# Patient Record
Sex: Male | Born: 1955 | Race: White | Hispanic: No | Marital: Married | Smoking: Never smoker
Health system: Southern US, Community
[De-identification: ages and names within clinical notes are randomized; demographics above are authoritative.]

## PROBLEM LIST (undated history)

## (undated) ENCOUNTER — Emergency Department (HOSPITAL_BASED_OUTPATIENT_CLINIC_OR_DEPARTMENT_OTHER): Admission: EM | Payer: Self-pay | Source: Home / Self Care

## (undated) ENCOUNTER — Emergency Department (HOSPITAL_BASED_OUTPATIENT_CLINIC_OR_DEPARTMENT_OTHER): Admission: EM | Payer: 59 | Source: Home / Self Care

## (undated) DIAGNOSIS — I10 Essential (primary) hypertension: Secondary | ICD-10-CM

## (undated) DIAGNOSIS — E039 Hypothyroidism, unspecified: Secondary | ICD-10-CM

## (undated) DIAGNOSIS — E782 Mixed hyperlipidemia: Secondary | ICD-10-CM

## (undated) DIAGNOSIS — Z8679 Personal history of other diseases of the circulatory system: Secondary | ICD-10-CM

## (undated) DIAGNOSIS — Z87442 Personal history of urinary calculi: Secondary | ICD-10-CM

## (undated) DIAGNOSIS — Z8673 Personal history of transient ischemic attack (TIA), and cerebral infarction without residual deficits: Secondary | ICD-10-CM

## (undated) DIAGNOSIS — M109 Gout, unspecified: Secondary | ICD-10-CM

## (undated) DIAGNOSIS — Z9889 Other specified postprocedural states: Secondary | ICD-10-CM

## (undated) DIAGNOSIS — I639 Cerebral infarction, unspecified: Secondary | ICD-10-CM

## (undated) HISTORY — DX: Hypothyroidism, unspecified: E03.9

## (undated) HISTORY — DX: Essential (primary) hypertension: I10

---

## 2003-06-06 ENCOUNTER — Encounter: Payer: Self-pay | Admitting: Family Medicine

## 2003-06-06 ENCOUNTER — Ambulatory Visit (HOSPITAL_COMMUNITY): Admission: RE | Admit: 2003-06-06 | Discharge: 2003-06-06 | Payer: Self-pay | Admitting: Family Medicine

## 2003-07-04 ENCOUNTER — Ambulatory Visit (HOSPITAL_COMMUNITY): Admission: RE | Admit: 2003-07-04 | Discharge: 2003-07-04 | Payer: Self-pay | Admitting: Internal Medicine

## 2003-07-04 HISTORY — PX: ESOPHAGOGASTRODUODENOSCOPY: SHX1529

## 2003-07-04 HISTORY — PX: COLONOSCOPY: SHX174

## 2004-09-01 ENCOUNTER — Ambulatory Visit: Payer: Self-pay | Admitting: Internal Medicine

## 2005-12-27 ENCOUNTER — Encounter: Admission: RE | Admit: 2005-12-27 | Discharge: 2005-12-27 | Payer: Self-pay | Admitting: Neurology

## 2007-06-28 ENCOUNTER — Ambulatory Visit: Payer: Self-pay | Admitting: Cardiology

## 2009-06-21 DIAGNOSIS — E236 Other disorders of pituitary gland: Secondary | ICD-10-CM | POA: Insufficient documentation

## 2009-06-21 DIAGNOSIS — I10 Essential (primary) hypertension: Secondary | ICD-10-CM

## 2009-06-21 DIAGNOSIS — M109 Gout, unspecified: Secondary | ICD-10-CM

## 2009-06-21 DIAGNOSIS — E782 Mixed hyperlipidemia: Secondary | ICD-10-CM

## 2009-06-21 DIAGNOSIS — E039 Hypothyroidism, unspecified: Secondary | ICD-10-CM | POA: Insufficient documentation

## 2011-02-23 NOTE — Assessment & Plan Note (Signed)
[facility]                            CARDIOLOGY OFFICE NOTE   ESTEN, DOLLAR                    MRN:          [id number]  DATE:06/28/2007                            DOB:          November 21, 1955    REFERRING PHYSICIAN:  Alfredia Client, M.D.   REASON FOR REFERRAL:  Evaluate patient with multiple cardiovascular risk  factors and peripheral vascular disease.   HISTORY OF PRESENT ILLNESS:  Patient is a very pleasant 55 year old  gentleman without prior cardiac history; however, he did have a right  thalamic infarct in February, 2007.  This was apparently related to  small vessel disease, although I do not have an MRI or CT.  He has seen  Dr. Pearlean Brownie.  He was told this was related to his dyslipidemia and  hypertension.  He has never had any cardiac issues and does not report  any cardiac workup in the past.  He denies any chest discomfort, neck or  arm discomfort.  He says he is active at work but does not exercise  routinely.  He has had no palpitations, presyncope, or syncope.  He  denies any shortness of breath.  He does not have any PND or orthopnea.   PAST MEDICAL HISTORY:  1. Hyperlipidemia for approximately two years.  2. Hypertension for two years.  3. 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  4. Hypothyroidism.  5. Gout.   PAST SURGICAL HISTORY:  None.   ALLERGIES:  None.   MEDICATIONS:  1. Aggrenox b.i.d.  2. Lotrel 5/40 daily.  3. Advicor 1000 mg daily.  4. Levoxyl 25 mcg daily.  5. Allopurinol 100 mg daily.   SOCIAL HISTORY:  Patient works at [employer].  He is not married.  He has three children ages 55, 64, and 36.  He has three grandchildren  ages 67, 59, and 2.  He has never smoked cigarettes.  He does not drink  alcohol.   FAMILY HISTORY:  Noncontributory for early coronary artery disease.  His  father died of lung cancer at age 43.  He had a brother who died at an  early age of lung and brain cancer.   REVIEW OF  SYSTEMS:  As stated in the HPI and positive for reflux.  Negative for other systems.   PHYSICAL EXAMINATION:  Patient is in no acute distress.  Blood pressure 124/76, heart rate 66 and regular.  Weight 228 pounds.  Body Mass Index 30.  HEENT:  Eyelids unremarkable.  Pupils are equal, round and reactive to  light.  Fundi not visualized.  Oral mucosa unremarkable.  NECK:  No jugular venous distention at 45 degrees.  Carotid upstroke  brisk and symmetric.  No bruits, no thyromegaly.  LYMPHATICS:  No cervical, axillary, or inguinal adenopathy.  LUNGS:  Clear to auscultation bilaterally.  BACK:  No costovertebral angle tenderness.  CHEST:  Unremarkable.  HEART:  PMI not displaced or sustained.  S1 and S2 within normal limits.  No S3, no S4, no clicks, rubs, or murmurs.  ABDOMEN:  Obese, positive bowel sounds.  Normal in frequency and pitch.  No bruits,  rebound, guarding.  There are no midline pulsatile masses.  No hepatomegaly, no splenomegaly.  SKIN:  No rashes, no nodules.  EXTREMITIES:  Pulses 2+ throughout.  No cyanosis, no clubbing, no edema.  NEUROLOGIC:  Alert and oriented to person, place, and time.  Cranial  nerves II-XII grossly intact.  Motor grossly intact throughout.   EKG:  Sinus rhythm, rate 66, axis within normal limits, intervals within  normal limits.  No acute ST-T wave changes.  Poor anterior R wave  progression.  Low voltage is on the limb leads.   ASSESSMENT/PLAN:  1. Vascular disease:  The patient does have evidence of vascular      disease in the central nervous system.  He has significant      cardiovascular risk factors.  He does not have any symptoms of      obstructive coronary disease; however, he needs screening with an      exercise treadmill test.  I think this would be most helpful in      ruling out obstructive coronary disease (low pre-test probability),      risk stratifying, and most importantly, giving him a prescription      for exercise.  2.  Hypertension:  Patient's blood pressure is well controlled.  Have      him continue medications as listed.  3. Dyslipidemia:  I agree with aggressive management per [facility].  4. Weight:  Patient understands the need to lose weight with diet and      exercise.  His Body Mass Index has crossed into the obese range.  5. Followup will be based on the results of the stress test.     Rollene Rotunda, MD, [facility]  Electronically Signed    JH/MedQ  DD: 06/28/2007  DT: 06/29/2007  Job #: [id number]   cc:   Ernestina Penna, M.D.

## 2011-02-26 NOTE — Consult Note (Signed)
NAME:  Russell Odom, Russell Odom NO.:  [id number]   MEDICAL RECORD NO.:  [id number]                   PATIENT TYPE:   LOCATION:                                       FACILITY:   PHYSICIAN:  R. Roetta Sessions, M.D.              DATE OF BIRTH:  06-18-1956   DATE OF CONSULTATION:  DATE OF DISCHARGE:                                   CONSULTATION   REFERRING PHYSICIAN:  Gaynelle Cage, MD   REASON FOR CONSULTATION:  Difficulty swallowing.   HISTORY OF PRESENT ILLNESS:  The patient is a 55 year old, Caucasian  gentleman who presents today for further evaluation of dysphagia.  He gives  a two-week history of what he describes as sensation of something stuck in  his throat.  This has now gone away.  However, upon further questioning, the  patient tells me for the last two to three years, he has had an episode  every month or so where he will be eating and then all of a sudden feels the  food will no longer go down.  He will sometimes either induce or have  spontaneous vomiting.  He has no episodes in between this, however.  He  denies any heartburn symptoms, abdominal pain, constipation, diarrhea or  melena.  He has had intermittent bright red blood noted on the toilet tissue  for the last one year.   CURRENT MEDICATIONS:  Indocin 75 mg p.r.n.   ALLERGIES:  No known drug allergies.   PAST MEDICAL HISTORY:  Gout.   PAST SURGICAL HISTORY:  Nasal surgery.   FAMILY HISTORY:  Father died of lung cancer.  His sister recently had a  partial intestinal resection for unclear reasons, although the patient tells  me they are 80% sure that it is not cancer.   SOCIAL HISTORY:  Divorced.  He has three daughters.  He is employed with  [employer].  He has never been a smoker.  He occasionally consumes  alcohol, generally once per month.   REVIEW OF SYMPTOMS:  GASTROINTESTINAL:  See HPI.  CONSTITUTIONAL:  Denies  any weight loss.  CARDIOPULMONARY:  Denies any chest pain or  shortness of  breath.   PHYSICAL EXAMINATION:  VITAL SIGNS:  Weight 250 pounds, height 6 feet 1  inch, temperature 95.6, blood pressure 130/90, pulse 56.  GENERAL:  Pleasant, well-developed, well-nourished, Caucasian male in no  acute distress.  SKIN:  Warm and dry.  No jaundice.  HEENT:  Conjunctivae are pink.  Sclerae nonicteric.  Oropharyngeal mucosa  moist and pink.  No lesions, erythema or exudate.  No lymphadenopathy or  thyromegaly.  CHEST:  Lungs clear to auscultation.  CARDIAC:  Regular rate and rhythm, normal S1, S2, no murmurs, rubs or  gallops.  ABDOMEN:  Positive bowel sounds.  Soft, nondistended, nontender.  No  organomegaly or masses.  He has very small, very reducible, nontender  umbilical hernia.  EXTREMITIES:  No edema.   LABORATORY DATA AND X-RAY FINDINGS:  Barium esophagram on June 06, 2003,  revealed small to moderate size sliding hiatal hernia with episodes of  spontaneous gastroesophageal reflux with slightly irregular mucosal pattern  seen in the mid and lower thoracic esophagus suggesting mild generalized  esophagitis.  A 13 mm barium tablet passed without any difficulty.   IMPRESSION:  Russell Odom is a 55 year old gentleman who has chronic  intermittent episodes of dysphagia primarily to solid foods.  At this time,  he has to induce vomiting to relieve his symptoms.  On recent barium  esophagram, there was no evidence of stricture; however, he did have  spontaneous gastroesophageal reflux as well as possible esophagitis.  The  patient denies any typical reflux symptoms.  I discussed with him  possibility of upper endoscopy for further evaluation of barium swallow  findings as well as at least empirical esophageal dilatation.  He would like  to proceed with this.  In addition, he has chronic, intermittent  hematochezia.   RECOMMENDATIONS:  1. EGD with dilatation and colonoscopy in the near future.  2. I discussed risks, benefits and alternatives with  the patient and he is     agreeable to proceed.  3. Further recommendations to follow.   I would like to thank Dr. Reola Calkins for allowing [place] to take part in the care of  this patient.     Tana Coast, Pricilla Larsson, M.D.    LL/MEDQ  D:  06/26/2003  T:  06/26/2003  Job:  [id number]

## 2011-02-26 NOTE — Op Note (Signed)
NAME:  ICHAEL, PULLARA                       ACCOUNT NO.:  [id number]   MEDICAL RECORD NO.:  [id number]                   PATIENT TYPE:  AMB   LOCATION:  DAY                                  FACILITY:  [facility]   PHYSICIAN:  R. Roetta Sessions, M.D.              DATE OF BIRTH:  1956/09/11   DATE OF PROCEDURE:  07/04/2003  DATE OF DISCHARGE:                                 OPERATIVE REPORT   PROCEDURE:  Esophagogastroduodenoscopy with Elease Hashimoto dilation followed by  colonoscopy.   INDICATIONS FOR PROCEDURE:  The patient is a 55 year old gentleman with  intermittent episodes of esophageal dysphagia to solids.  He also has had  intermittent hematochezia.  EGD and colonoscopy are now being done.  This  approach has been discussed with the patient at length previously.  The  potential risks, benefits, and alternatives have been reviewed and questions  answered.  Please see the documentation in the medical record and the  dictated H&P from 06/26/2003.   PROCEDURE:  O2 saturation, blood pressure, pulses, and respirations were  monitored throughout the entirety of both procedures.  Conscious sedation  was with Versed 3 mg IV, Demerol 75 mg IV in divided doses.  The instrument  used was the Olympus video chip adult gastroscope and colonoscope.   EGD FINDINGS:  Examination of the tubular esophagus revealed multiple linear  esophageal erosions extending as proximally as one-half up the tubular  esophagus.  There was no evidence of Barrett's esophagus or neoplasia.  There was no obvious ring.  The EG junction was easily traversed.   Stomach:  The gastric cavity was empty and insufflated well with air.  Thorough examination of the gastric mucosa including retroflex view in the  proximal stomach and esophagogastric junction demonstrated multiple antral  erosions.  There was no infiltrating process or frank ulcer.  The pylorus  was patent and easily traversed.   Duodenum:  Examination of the bulb  revealed, again, multiple erosions in the  bulb.  D2 was normal.   THERAPEUTIC/DIAGNOSTIC MANEUVERS PERFORMED:  A 56 French Maloney dilator was  passed with ease.  A look back revealed no apparent complication related to  passage of the dilator.   The patient tolerated the procedure well and was prepared for colonoscopy.   EGD FINDINGS:  Digital rectal examination revealed no abnormalities.   ENDOSCOPIC FINDINGS:  The prep was adequate.   Rectum:  Examination of the rectal mucosa including retroflex view of the  anal verge and en face view of the anal canal revealed only anal canal  hemorrhoids.   Colon:  The colonic mucosa was surveyed from the rectosigmoid junction  through the left, transverse, right colon to the area of the appendiceal  orifice, ileocecal valve, and cecum.  These structures were well-seen and  photographed for the record.  The colonic mucosa all the way to the cecum  appeared normal.  From the level of the cecum  and ileocecal valve, the scope  was slowly withdrawn.  All previously mentioned mucosal surfaces were again  seen, and again, no other abnormalities were observed.  The patient  tolerated both procedures well and was reactive in endoscopy.   EGD IMPRESSION:  1. Multiple linear esophageal erosions consistent with mild-to-moderate     erosive reflux esophagitis, status post passage of a Maloney dilator.  2. Multiple antral and duodenal bulbar erosions; otherwise, the remainder of     the stomach and the first and second portions of the duodenum appeared     normal.   I suspect the findings as seen in the stomach and bulb are secondary to  Indocin effect.   RECOMMENDATIONS:  1. Will check H pylori serologies today.  2. Gastroesophageal reflux literature provided to Mr. Hoban.  3. Begin Aciphex 20 mg orally daily before breakfast.  He is to go by my     office for samples, and a prescription has been provided.  4. Hemorrhoid literature provided to  Mr. Patil.  5. A 10-day course of Anusol-HC suppositories, one per rectum at bedtime.  6. Followup appointment with me in four to six weeks to assess his progress.  7. Follow up on H pylori serologies.       ___________________________________________                                            Jonathon Bellows, M.D.   RMR/MEDQ  D:  07/04/2003  T:  07/04/2003  Job:  [id number]   cc:   Gaynelle Cage, MD  [phone] [address]  [place]  [place] [address]  Fax: [phone]

## 2012-10-18 ENCOUNTER — Encounter: Payer: Self-pay | Admitting: Internal Medicine

## 2012-10-19 ENCOUNTER — Encounter (HOSPITAL_COMMUNITY): Payer: Self-pay | Admitting: Pharmacy Technician

## 2012-10-19 ENCOUNTER — Encounter: Payer: Self-pay | Admitting: Urgent Care

## 2012-10-19 ENCOUNTER — Ambulatory Visit (INDEPENDENT_AMBULATORY_CARE_PROVIDER_SITE_OTHER): Payer: Managed Care, Other (non HMO) | Admitting: Urgent Care

## 2012-10-19 VITALS — BP 127/74 | HR 75 | Temp 97.0°F | Ht 73.0 in | Wt 239.4 lb

## 2012-10-19 DIAGNOSIS — Z800 Family history of malignant neoplasm of digestive organs: Secondary | ICD-10-CM | POA: Insufficient documentation

## 2012-10-19 DIAGNOSIS — K921 Melena: Secondary | ICD-10-CM | POA: Insufficient documentation

## 2012-10-19 MED ORDER — PEG 3350-KCL-NA BICARB-NACL 420 G PO SOLR: 4000.0000 mL | ORAL | Status: DC

## 2012-10-19 NOTE — Progress Notes (Signed)
Referring Provider: Dr. Massenburg Primary Care Physician:  MASSENBURG,O'LAF, PA Primary Gastroenterologist:  Dr. Rourk  Chief Complaint  Patient presents with  . Colonoscopy    hematochezia, Family Hx of colon ca    HPI:  Russell Odom is a 56 y.o. male here as a referral from Dr. Massenburg for colonoscopy given family history of colon cancer diagnosed at age 58 in his sister.  His last colonoscopy was normal by Dr Rourk in 2004.  He has noticed scant hematochezia on several occasions with wiping on toilet tissue over past couple months.   Denies constipation, diarrhea, or weight loss.   Denies heartburn, indigestion, nausea, vomiting, dysphagia, odynophagia or anorexia.  On aggrenox. Denies OTC NSAIDS.  Past Medical History  Diagnosis Date  . CVA (cerebral vascular accident)   . HTN (hypertension)   . Hypothyroid   . Gout     Past Surgical History  Procedure Date  . Esophagogastroduodenoscopy 07/04/2003    RMR: Multiple linear esophageal erosions consistent with mild-to-moderate  erosive reflux esophagitis, status post passage of a 56 F Maloney dilator/ Multiple antral and duodenal bulbar erosions; otherwise, the remainder of the stomach and the first and second portions of the duodenum appeared normal  . Colonoscopy   07/04/2003    RMR: anal canal hemorrhoids, normal colon    Current Outpatient Prescriptions  Medication Sig Dispense Refill  . allopurinol (ZYLOPRIM) 100 MG tablet Take 100 mg by mouth daily.      . amLODipine-benazepril (LOTREL) 5-40 MG per capsule Take 1 capsule by mouth daily.      . dipyridamole-aspirin (AGGRENOX) 200-25 MG per 12 hr capsule Take 1 capsule by mouth 2 (two) times daily.      . levothyroxine (SYNTHROID, LEVOTHROID) 50 MCG tablet Take 50 mcg by mouth daily.      . Multiple Vitamins-Minerals (MULTIVITAMINS THER. W/MINERALS) TABS Take 1 tablet by mouth daily.      . niacin-lovastatin (ADVICOR) 1000-20 MG 24 hr tablet Take 1 tablet by mouth at  bedtime.        Allergies as of 10/19/2012  . (No Known Allergies)    Family History  Problem Relation Age of Onset  . Colon cancer Sister 58    History   Social History  . Marital Status: Divorced    Spouse Name: N/A    Number of Children: 3  . Years of Education: N/A   Occupational History  . Supervisor at [employer]    Social History Main Topics  . Smoking status: Never Smoker   . Smokeless tobacco: Not on file  . Alcohol Use: No  . Drug Use: No  . Sexually Active: Not on file   Other Topics Concern  . Not on file   Social History Narrative  . No narrative on file    Review of Systems: Gen: Denies any fever, chills, sweats, anorexia, fatigue, weakness, malaise, weight loss, and sleep disorder CV: Denies chest pain, angina, palpitations, syncope, orthopnea, PND, peripheral edema, and claudication. Resp: Denies dyspnea at rest, dyspnea with exercise, cough, sputum, wheezing, coughing up blood, and pleurisy. GI: Denies vomiting blood, jaundice, and fecal incontinence. GU : Denies urinary burning, blood in urine, urinary frequency, urinary hesitancy, nocturnal urination, and urinary incontinence. MS: Denies joint pain, limitation of movement, and swelling, stiffness, low back pain, extremity pain. Denies muscle weakness, cramps, atrophy.  Derm: Denies rash, itching, dry skin, hives, moles, warts, or unhealing ulcers.  Psych: Denies depression, anxiety, memory loss, suicidal ideation, hallucinations,   paranoia, and confusion. Heme: Denies bruising or enlarged lymph nodes. Neuro:  Denies any headaches, dizziness, paresthesias. Endo:  Denies any problems with DM, thyroid, adrenal function.  Physical Exam: BP 127/74  Pulse 75  Temp 97 F (36.1 C) (Oral)  Ht 6' 1" (1.854 m)  Wt 239 lb 6.4 oz (108.591 kg)  BMI 31.58 kg/m2 No LMP for male patient. General:   Alert,  Well-developed, well-nourished, pleasant and cooperative in NAD Head:  Normocephalic and  atraumatic. Eyes:  Sclera clear, no icterus.   Conjunctiva pink. Ears:  Normal auditory acuity. Nose:  No deformity, discharge, or lesions. Mouth:  No deformity or lesions,oropharynx pink & moist. Neck:  Supple; no masses or thyromegaly. Lungs:  Clear throughout to auscultation.   No wheezes, crackles, or rhonchi. No acute distress. Heart:  Regular rate and rhythm; no murmurs, clicks, rubs,  or gallops. Abdomen:  Normal bowel sounds.  No bruits.  Soft, non-tender and non-distended without masses, hepatosplenomegaly or hernias noted.  No guarding or rebound tenderness.   Rectal:  Deferred.  Msk:  Symmetrical without gross deformities. Normal posture. Pulses:  Normal pulses noted. Extremities:  No clubbing or edema. Neurologic:  Alert and oriented x4;  grossly normal neurologically. Skin:  Intact without significant lesions or rashes. Lymph Nodes:  No significant cervical adenopathy. Psych:  Alert and cooperative. Normal mood and affect.  

## 2012-10-19 NOTE — Assessment & Plan Note (Signed)
Russell Odom is a pleasant 57 y.o. male with family history of colon cancer in his sister at age 94.  Due for colonoscopy, but also having scant hematochezia. Suspect benign anorectal source, but cannot r/o colorectal ca or polyp.  Colonoscopy with Dr Jena Gauss.  I have discussed risks & benefits which include, but are not limited to, bleeding, infection, perforation & drug reaction.  The patient agrees with this plan & written consent will be obtained.

## 2012-10-19 NOTE — Patient Instructions (Addendum)
Colonoscopy with Dr Jena Gauss  Rectal Bleeding Rectal bleeding is when blood passes out of the anus. It is usually a sign that something is wrong. It may not be serious, but it should always be evaluated. Rectal bleeding may present as bright red blood or extremely dark stools. The color may range from dark red or maroon to black (like tar). It is important that the cause of rectal bleeding be identified so treatment can be started and the problem corrected. CAUSES   Hemorrhoids. These are enlarged (dilated) blood vessels or veins in the anal or rectal area.  Fistulas. Theseare abnormal, burrowing channels that usually run from inside the rectum to the skin around the anus. They can bleed.  Anal fissures. This is a tear in the tissue of the anus. Bleeding occurs with bowel movements.  Diverticulosis. This is a condition in which pockets or sacs project from the bowel wall. Occasionally, the sacs can bleed.  Diverticulitis. Thisis an infection involving diverticulosis of the colon.  Proctitis and colitis. These are conditions in which the rectum, colon, or both, can become inflamed and pitted (ulcerated).  Polyps and cancer. Polyps are non-cancerous (benign) growths in the colon that may bleed. Certain types of polyps turn into cancer.  Protrusion of the rectum. Part of the rectum can project from the anus and bleed.  Certain medicines.  Intestinal infections.  Blood vessel abnormalities. HOME CARE INSTRUCTIONS  Eat a high-fiber diet to keep your stool soft.  Limit activity.  Drink enough fluids to keep your urine clear or pale yellow.  Warm baths may be useful to soothe rectal pain.  Follow up with your caregiver as directed. SEEK IMMEDIATE MEDICAL CARE IF:  You develop increased bleeding.  You have black or dark red stools.  You vomit blood or material that looks like coffee grounds.  You have abdominal pain or tenderness.  You have a fever.  You feel weak, nauseous,  or you faint.  You have severe rectal pain or you are unable to have a bowel movement. MAKE SURE YOU:  Understand these instructions.  Will watch your condition.  Will get help right away if you are not doing well or get worse. Document Released: 03/19/2002 Document Revised: 12/20/2011 Document Reviewed: 03/14/2011 [facility] Patient Information 2013 [place], [place].

## 2012-10-19 NOTE — Assessment & Plan Note (Signed)
See FH colon ca

## 2012-10-20 NOTE — Progress Notes (Signed)
Faxed to PCP

## 2012-11-02 ENCOUNTER — Ambulatory Visit (HOSPITAL_COMMUNITY)
Admission: RE | Admit: 2012-11-02 | Discharge: 2012-11-02 | Disposition: A | Discharge status: Home / Self Care | Payer: Managed Care, Other (non HMO) | Source: Ambulatory Visit | Attending: Internal Medicine | Admitting: Internal Medicine

## 2012-11-02 ENCOUNTER — Encounter (HOSPITAL_COMMUNITY): Payer: Self-pay

## 2012-11-02 ENCOUNTER — Encounter (HOSPITAL_COMMUNITY)
Admission: RE | Disposition: A | Discharge status: Home / Self Care | Payer: Self-pay | Source: Ambulatory Visit | Attending: Internal Medicine

## 2012-11-02 DIAGNOSIS — K5730 Diverticulosis of large intestine without perforation or abscess without bleeding: Secondary | ICD-10-CM

## 2012-11-02 DIAGNOSIS — D126 Benign neoplasm of colon, unspecified: Secondary | ICD-10-CM | POA: Insufficient documentation

## 2012-11-02 DIAGNOSIS — K921 Melena: Secondary | ICD-10-CM | POA: Insufficient documentation

## 2012-11-02 DIAGNOSIS — I10 Essential (primary) hypertension: Secondary | ICD-10-CM | POA: Insufficient documentation

## 2012-11-02 DIAGNOSIS — Z800 Family history of malignant neoplasm of digestive organs: Secondary | ICD-10-CM

## 2012-11-02 HISTORY — PX: COLONOSCOPY: SHX5424

## 2012-11-02 SURGERY — COLONOSCOPY
Anesthesia: Moderate Sedation

## 2012-11-02 MED ORDER — STERILE WATER FOR IRRIGATION IR SOLN
Status: DC | PRN
  Administered 2012-11-02: 10:00:00

## 2012-11-02 MED ORDER — ONDANSETRON HCL 4 MG/2ML IJ SOLN
INTRAMUSCULAR | Status: AC
  Filled 2012-11-02: qty 2

## 2012-11-02 MED ORDER — SODIUM CHLORIDE 0.45 % IV SOLN
INTRAVENOUS | Status: DC
  Administered 2012-11-02: 09:00:00 via INTRAVENOUS

## 2012-11-02 MED ORDER — MEPERIDINE HCL 100 MG/ML IJ SOLN
INTRAMUSCULAR | Status: DC | PRN
  Administered 2012-11-02 (×2): 50 mg via INTRAVENOUS
  Administered 2012-11-02: 25 mg via INTRAVENOUS

## 2012-11-02 MED ORDER — MEPERIDINE HCL 100 MG/ML IJ SOLN
INTRAMUSCULAR | Status: AC
  Filled 2012-11-02: qty 2

## 2012-11-02 MED ORDER — MIDAZOLAM HCL 5 MG/5ML IJ SOLN
INTRAMUSCULAR | Status: DC | PRN
  Administered 2012-11-02: 1 mg via INTRAVENOUS
  Administered 2012-11-02 (×2): 2 mg via INTRAVENOUS

## 2012-11-02 MED ORDER — MIDAZOLAM HCL 5 MG/5ML IJ SOLN
INTRAMUSCULAR | Status: AC
  Filled 2012-11-02: qty 10

## 2012-11-02 NOTE — Op Note (Signed)
[facility] [address] [place], [address]   COLONOSCOPY PROCEDURE REPORT  PATIENT: Russell Odom, Russell Odom  MR#:         [id number] BIRTHDATE: 05/17/1956 , 56  yrs. old GENDER: Male ENDOSCOPIST:   Dr. Jena Gauss REFERRED BY:       [place] PROCEDURE DATE:  11/02/2012 PROCEDURE:     Ileo-colonoscopy with snare polypectomy  INDICATIONS: hematochezia; positive family history of colon cancer  INFORMED CONSENT:  The risks, benefits, alternatives and imponderables including but not limited to bleeding, perforation as well as the possibility of a missed lesion have been reviewed.  The potential for biopsy, lesion removal, etc. have also been discussed.  Questions have been answered.  All parties agreeable. Please see the history and physical in the medical record for more information.  MEDICATIONS: Versed 5 mg IV and Demerol 125 mg IV in divided doses. Zofran 4 mg IV  DESCRIPTION OF PROCEDURE:  After a digital rectal exam was performed, the EC-[id number] ([id number])  colonoscope was advanced from the anus through the rectum and colon to the area of the cecum, ileocecal valve and appendiceal orifice.  The cecum was deeply intubated.  These structures were well-seen and photographed for the record.  From the level of the cecum and ileocecal valve, the scope was slowly and cautiously withdrawn.  The mucosal surfaces were carefully surveyed utilizing scope tip deflection to facilitate fold flattening as needed.  The scope was pulled down into the rectum where a thorough examination including retroflexion was performed.    FINDINGS: Friable anorectum. However, no significant hemorrhoids. Normal rectum. Left-sided diverticula. (1) 6 mm polyp in the mid descending segment; otherwise, the remainder of the colonic mucosa appeared normal.  THERAPEUTIC / DIAGNOSTIC MANEUVERS PERFORMED:  The above-mentioned polyp was removed with hot snare technique  COMPLICATIONS: None  CECAL  WITHDRAWAL TIME:  17 minutes  IMPRESSION:  Friable Anorectum - likely source of hematochezia. Colonic diverticulosis. Colonic polyp-removed as described above.  RECOMMENDATIONS: Course of Anusol suppositories. Daily fiber supplement-Benefiber 2 tablespoons. Followup on pathology.   _______________________________ eSigned:  R. Roetta Sessions, MD FACP [facility] 11/02/2012 10:58 AM   CC:    PATIENT NAME:  Russell Odom MR#: [id number]

## 2012-11-02 NOTE — H&P (View-Only) (Signed)
Referring Provider: Dr. Pollyann Kennedy Primary Care Physician:  MASSENBURG,O'LAF, PA Primary Gastroenterologist:  Dr. Jena Gauss  Chief Complaint  Patient presents with  . Colonoscopy    hematochezia, Family Hx of colon ca    HPI:  Russell Odom is a 57 y.o. male here as a referral from Dr. Pollyann Kennedy for colonoscopy given family history of colon cancer diagnosed at age 35 in his sister.  His last colonoscopy was normal by Dr Jena Gauss in 2004.  He has noticed scant hematochezia on several occasions with wiping on toilet tissue over past couple months.   Denies constipation, diarrhea, or weight loss.   Denies heartburn, indigestion, nausea, vomiting, dysphagia, odynophagia or anorexia.  On aggrenox. Denies OTC NSAIDS.  Past Medical History  Diagnosis Date  . CVA (cerebral vascular accident)   . HTN (hypertension)   . Hypothyroid   . Gout     Past Surgical History  Procedure Date  . Esophagogastroduodenoscopy 07/04/2003    RMR: Multiple linear esophageal erosions consistent with mild-to-moderate  erosive reflux esophagitis, status post passage of a 23 F Maloney dilator/ Multiple antral and duodenal bulbar erosions; otherwise, the remainder of the stomach and the first and second portions of the duodenum appeared normal  . Colonoscopy   07/04/2003    RMR: anal canal hemorrhoids, normal colon    Current Outpatient Prescriptions  Medication Sig Dispense Refill  . allopurinol (ZYLOPRIM) 100 MG tablet Take 100 mg by mouth daily.      Marland Kitchen amLODipine-benazepril (LOTREL) 5-40 MG per capsule Take 1 capsule by mouth daily.      Marland Kitchen dipyridamole-aspirin (AGGRENOX) 200-25 MG per 12 hr capsule Take 1 capsule by mouth 2 (two) times daily.      Marland Kitchen levothyroxine (SYNTHROID, LEVOTHROID) 50 MCG tablet Take 50 mcg by mouth daily.      . Multiple Vitamins-Minerals (MULTIVITAMINS THER. W/MINERALS) TABS Take 1 tablet by mouth daily.      . niacin-lovastatin (ADVICOR) 1000-20 MG 24 hr tablet Take 1 tablet by mouth at  bedtime.        Allergies as of 10/19/2012  . (No Known Allergies)    Family History  Problem Relation Age of Onset  . Colon cancer Sister 48    History   Social History  . Marital Status: Divorced    Spouse Name: N/A    Number of Children: 3  . Years of Education: N/A   Occupational History  . Supervisor at [employer]    Social History Main Topics  . Smoking status: Never Smoker   . Smokeless tobacco: Not on file  . Alcohol Use: No  . Drug Use: No  . Sexually Active: Not on file   Other Topics Concern  . Not on file   Social History Narrative  . No narrative on file    Review of Systems: Gen: Denies any fever, chills, sweats, anorexia, fatigue, weakness, malaise, weight loss, and sleep disorder CV: Denies chest pain, angina, palpitations, syncope, orthopnea, PND, peripheral edema, and claudication. Resp: Denies dyspnea at rest, dyspnea with exercise, cough, sputum, wheezing, coughing up blood, and pleurisy. GI: Denies vomiting blood, jaundice, and fecal incontinence. GU : Denies urinary burning, blood in urine, urinary frequency, urinary hesitancy, nocturnal urination, and urinary incontinence. MS: Denies joint pain, limitation of movement, and swelling, stiffness, low back pain, extremity pain. Denies muscle weakness, cramps, atrophy.  Derm: Denies rash, itching, dry skin, hives, moles, warts, or unhealing ulcers.  Psych: Denies depression, anxiety, memory loss, suicidal ideation, hallucinations,  paranoia, and confusion. Heme: Denies bruising or enlarged lymph nodes. Neuro:  Denies any headaches, dizziness, paresthesias. Endo:  Denies any problems with DM, thyroid, adrenal function.  Physical Exam: BP 127/74  Pulse 75  Temp 97 F (36.1 C) (Oral)  Ht 6\' 1"  (1.854 m)  Wt 239 lb 6.4 oz (108.591 kg)  BMI 31.58 kg/m2 No LMP for male patient. General:   Alert,  Well-developed, well-nourished, pleasant and cooperative in NAD Head:  Normocephalic and  atraumatic. Eyes:  Sclera clear, no icterus.   Conjunctiva pink. Ears:  Normal auditory acuity. Nose:  No deformity, discharge, or lesions. Mouth:  No deformity or lesions,oropharynx pink & moist. Neck:  Supple; no masses or thyromegaly. Lungs:  Clear throughout to auscultation.   No wheezes, crackles, or rhonchi. No acute distress. Heart:  Regular rate and rhythm; no murmurs, clicks, rubs,  or gallops. Abdomen:  Normal bowel sounds.  No bruits.  Soft, non-tender and non-distended without masses, hepatosplenomegaly or hernias noted.  No guarding or rebound tenderness.   Rectal:  Deferred.  Msk:  Symmetrical without gross deformities. Normal posture. Pulses:  Normal pulses noted. Extremities:  No clubbing or edema. Neurologic:  Alert and oriented x4;  grossly normal neurologically. Skin:  Intact without significant lesions or rashes. Lymph Nodes:  No significant cervical adenopathy. Psych:  Alert and cooperative. Normal mood and affect.

## 2012-11-02 NOTE — Interval H&P Note (Signed)
History and Physical Interval Note:  11/02/2012 9:46 AM  Russell Odom  has presented today for surgery, with the diagnosis of HEMATOCHEZIA  AND FAMILY HX OF COLON CA  The various methods of treatment have been discussed with the patient and family. After consideration of risks, benefits and other options for treatment, the patient has consented to  Procedure(s) (LRB) with comments: COLONOSCOPY (N/A) - 9:30 as a surgical intervention .  The patient's history has been reviewed, patient examined, no change in status, stable for surgery.  I have reviewed the patient's chart and labs.  Questions were answered to the patient's satisfaction.     Eula Listen  Colonoscopy today per plan.The risks, benefits, limitations, alternatives and imponderables have been reviewed with the patient. Questions have been answered. All parties are agreeable.

## 2012-11-04 ENCOUNTER — Encounter: Payer: Self-pay | Admitting: Internal Medicine

## 2012-11-06 ENCOUNTER — Encounter (HOSPITAL_COMMUNITY): Payer: Self-pay | Admitting: Internal Medicine

## 2012-11-06 ENCOUNTER — Encounter: Payer: Self-pay | Admitting: *Deleted

## 2012-11-25 ENCOUNTER — Other Ambulatory Visit: Payer: Self-pay

## 2013-02-05 ENCOUNTER — Ambulatory Visit (INDEPENDENT_AMBULATORY_CARE_PROVIDER_SITE_OTHER): Payer: Managed Care, Other (non HMO) | Admitting: Nurse Practitioner

## 2013-02-05 ENCOUNTER — Encounter: Payer: Self-pay | Admitting: Nurse Practitioner

## 2013-02-05 VITALS — BP 129/75 | HR 63 | Ht 75.0 in | Wt 243.0 lb

## 2013-02-05 DIAGNOSIS — I63219 Cerebral infarction due to unspecified occlusion or stenosis of unspecified vertebral arteries: Secondary | ICD-10-CM

## 2013-02-05 DIAGNOSIS — I10 Essential (primary) hypertension: Secondary | ICD-10-CM

## 2013-02-05 DIAGNOSIS — E782 Mixed hyperlipidemia: Secondary | ICD-10-CM

## 2013-02-05 NOTE — Progress Notes (Signed)
HPI:  Patient returns for followup after last visit 02/07/2012. 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 He was initiated on Aggrenox and has done well since that time. He has continued to have paresthesias of the left index finger from time to time he claims he is tolerating the Aggrenox without any headaches or other side effects. He continues to have good blood pressure and lipid control he continues to play golf weekly. He denies double vision, loss of vision, focal weakness,  speech or swallowing problems, confusions, blackouts.    ROS:  negative  Physical Exam General: well developed, well nourished, seated, in no evident distress Head: head normocephalic and atraumatic. Oropharynx benign Neck: supple with no carotid or supraclavicular bruits Cardiovascular: regular rate and rhythm, no murmurs  Neurologic Exam Mental Status: Awake and fully alert. Oriented to place and time. Recent and remote memory intact. Attention span, concentration and fund of knowledge appropriate. Mood and affect appropriate.  Cranial Nerves:  Pupils equal, briskly reactive to light. Extraocular movements full without nystagmus. Visual fields full to confrontation. Hearing intact and symmetric to finger snap. Facial sensation intact. Face, tongue, palate move normally and symmetrically. Neck flexion and extension normal.  Motor: Normal bulk and tone. Normal strength in all tested extremity muscles. Sensory.: intact to touch and pinprick and vibratory.  Coordination: Rapid alternating movements normal in all extremities. Finger-to-nose and heel-to-shin performed accurately bilaterally. Gait and Station: Arises from chair without difficulty. Stance is normal. Gait demonstrates normal stride length and balance . Able to heel, toe and tandem walk without difficulty.  Reflexes: 2+ and symmetric. Toes downgoing.     ASSESSMENT: History of right parietal infarct February 2007, has  done well since that time.     PLAN: Continue Aggrenox for secondary stroke prevention Keep blood pressure less than 130 systolic Cholesterol less than 200 and LDL less than 100. Will repeat carotid Doppler, last in 2011 Followup yearly  Nilda Riggs, [facility] APRN

## 2013-02-05 NOTE — Patient Instructions (Addendum)
Continue Aggrenox for secondary stroke prevention Keep blood pressure less than 130 systolic Cholesterol less than 200 and LDL less than 100. Will repeat carotid Doppler

## 2013-02-13 ENCOUNTER — Ambulatory Visit (INDEPENDENT_AMBULATORY_CARE_PROVIDER_SITE_OTHER): Payer: Managed Care, Other (non HMO)

## 2013-02-13 DIAGNOSIS — I63219 Cerebral infarction due to unspecified occlusion or stenosis of unspecified vertebral arteries: Secondary | ICD-10-CM

## 2013-02-20 ENCOUNTER — Telehealth: Payer: Self-pay | Admitting: Nurse Practitioner

## 2013-02-20 NOTE — Telephone Encounter (Signed)
Please let patient know doppler study was negative for significant stenosis.

## 2013-02-21 NOTE — Telephone Encounter (Signed)
Spoke to patient and he is aware of his doppler results.

## 2013-08-16 ENCOUNTER — Other Ambulatory Visit: Payer: Self-pay

## 2013-09-13 ENCOUNTER — Encounter (HOSPITAL_BASED_OUTPATIENT_CLINIC_OR_DEPARTMENT_OTHER): Payer: Self-pay | Admitting: Emergency Medicine

## 2013-09-13 ENCOUNTER — Emergency Department (HOSPITAL_BASED_OUTPATIENT_CLINIC_OR_DEPARTMENT_OTHER)
Admission: EM | Admit: 2013-09-13 | Discharge: 2013-09-14 | Disposition: A | Discharge status: Home / Self Care | Payer: Managed Care, Other (non HMO) | Attending: Emergency Medicine | Admitting: Emergency Medicine

## 2013-09-13 DIAGNOSIS — M109 Gout, unspecified: Secondary | ICD-10-CM | POA: Insufficient documentation

## 2013-09-13 DIAGNOSIS — Z8673 Personal history of transient ischemic attack (TIA), and cerebral infarction without residual deficits: Secondary | ICD-10-CM | POA: Insufficient documentation

## 2013-09-13 DIAGNOSIS — E039 Hypothyroidism, unspecified: Secondary | ICD-10-CM | POA: Insufficient documentation

## 2013-09-13 DIAGNOSIS — I10 Essential (primary) hypertension: Secondary | ICD-10-CM | POA: Insufficient documentation

## 2013-09-13 DIAGNOSIS — N200 Calculus of kidney: Secondary | ICD-10-CM

## 2013-09-13 DIAGNOSIS — Z79899 Other long term (current) drug therapy: Secondary | ICD-10-CM | POA: Insufficient documentation

## 2013-09-13 LAB — URINALYSIS, ROUTINE W REFLEX MICROSCOPIC
Bilirubin Urine: NEGATIVE
Glucose, UA: NEGATIVE mg/dL
Ketones, ur: 15 mg/dL — AB
Leukocytes, UA: NEGATIVE
Protein, ur: NEGATIVE mg/dL
pH: 6 (ref 5.0–8.0)

## 2013-09-13 LAB — URINE MICROSCOPIC-ADD ON

## 2013-09-13 MED ORDER — TAMSULOSIN HCL 0.4 MG PO CAPS: 0.4000 mg | ORAL_CAPSULE | Freq: Every day | ORAL | Status: DC

## 2013-09-13 MED ORDER — KETOROLAC TROMETHAMINE 30 MG/ML IJ SOLN
60.0000 mg | Freq: Once | INTRAMUSCULAR | Status: AC
  Administered 2013-09-13: 60 mg via INTRAMUSCULAR
  Filled 2013-09-13: qty 2

## 2013-09-13 MED ORDER — KETOROLAC TROMETHAMINE 30 MG/ML IJ SOLN
INTRAMUSCULAR | Status: AC
  Filled 2013-09-13: qty 1

## 2013-09-13 MED ORDER — IBUPROFEN 800 MG PO TABS: 800.0000 mg | ORAL_TABLET | Freq: Three times a day (TID) | ORAL | Status: DC

## 2013-09-13 NOTE — ED Notes (Signed)
MD at bedside. 

## 2013-09-13 NOTE — ED Notes (Signed)
Family at bedside. 

## 2013-09-13 NOTE — ED Notes (Signed)
Left flank pain since last night. States he was seen at [facility] this am. Was diagnosed with kidney stones and given Percocet. Last dose 4 hours ago.

## 2013-09-13 NOTE — ED Provider Notes (Signed)
TIME SEEN: 10:36 PM This chart was scribed for Russell Maw Ward, DO by Clydene Laming, ED Scribe. This patient was seen in room MH10/MH10 and the patient's care was started at 10:36 PM. CHIEF COMPLAINT: Kidney Stone  HPI:  HPI Comments: Russell Odom is a 57 y.o. male with history of hypertension, stroke, gout, prior kidney stones who presents to the Emergency Department complaining of a left lower quadrant pain that he describes as sharp without radiation without aggravating or alleviating factors. He feels similar to his prior kidney stones. The pain started this morning. He was seen in urgent care and was diagnosed with a 6 mm kidney stone seen on CT scan. He was given Percocet for pain relief. He states he's taken one tablet at home but he is unable to control his pain. He has had nausea, vomiting and diarrhea. No fevers or chills. Denies any dysuria or hematuria. No testicular pain or swelling. No penile discharge.   ROS: See HPI Constitutional: no fever  Eyes: no drainage  ENT: no runny nose   Cardiovascular:  no chest pain  Resp: no SOB  GI: no vomiting GU: no dysuria Integumentary: no rash  Allergy: no hives  Musculoskeletal: no leg swelling  Neurological: no slurred speech ROS otherwise negative  PAST MEDICAL HISTORY/PAST SURGICAL HISTORY:  Past Medical History  Diagnosis Date  . CVA (cerebral vascular accident)   . HTN (hypertension)   . Hypothyroid   . Gout     MEDICATIONS:  Prior to Admission medications   Medication Sig Start Date End Date Taking? Authorizing Provider  allopurinol (ZYLOPRIM) 100 MG tablet Take 100 mg by mouth daily.    Historical Provider, MD  amLODipine-benazepril (LOTREL) 5-40 MG per capsule Take 1 capsule by mouth daily.    Historical Provider, MD  dipyridamole-aspirin (AGGRENOX) 200-25 MG per 12 hr capsule Take 1 capsule by mouth 2 (two) times daily.    Historical Provider, MD  levothyroxine (SYNTHROID, LEVOTHROID) 50 MCG tablet Take 50 mcg by  mouth daily.    Historical Provider, MD  Multiple Vitamins-Minerals (MULTIVITAMINS THER. W/MINERALS) TABS Take 1 tablet by mouth daily.    Historical Provider, MD  niacin-lovastatin (ADVICOR) 1000-20 MG 24 hr tablet Take 1 tablet by mouth at bedtime.    Historical Provider, MD    ALLERGIES:  No Known Allergies  SOCIAL HISTORY:  History  Substance Use Topics  . Smoking status: Never Smoker   . Smokeless tobacco: Never Used  . Alcohol Use: No    FAMILY HISTORY: Family History  Problem Relation Age of Onset  . Colon cancer Sister 96    EXAM: Triage Vitals:BP 156/90  Pulse 94  Temp(Src) 98.7 F (37.1 C) (Oral)  Resp 16  Ht 6\' 1"  (1.854 m)  Wt 241 lb (109.317 kg)  BMI 31.80 kg/m2  SpO2 96% CONSTITUTIONAL: Alert and oriented and responds appropriately to questions. Well-appearing; well-nourished HEAD: Normocephalic EYES: Conjunctivae clear, PERRL ENT: normal nose; no rhinorrhea; moist mucous membranes; pharynx without lesions noted NECK: Supple, no meningismus, no LAD  CARD: RRR; S1 and S2 appreciated; no murmurs, no clicks, no rubs, no gallops RESP: Normal chest excursion without splinting or tachypnea; breath sounds clear and equal bilaterally; no wheezes, no rhonchi, no rales,  ABD/GI: Normal bowel sounds; non-distended; soft, non-tender, no rebound, no guarding BACK:  The back appears normal and is non-tender to palpation, there is no CVA tenderness EXT: Normal ROM in all joints; non-tender to palpation; no edema; normal capillary refill; no cyanosis  SKIN: Normal color for age and race; warm NEURO: Moves all extremities equally PSYCH: The patient's mood and manner are appropriate. Grooming and personal hygiene are appropriate.  MEDICAL DECISION MAKING: Patient here with pain from recent diagnosis left-sided kidney stone. He is well-appearing, hemodynamically stable. His abdominal exam is benign. Will give Toradol and reassess. His urine shows no sign of infection but  there is large hemoglobin. She reports she had normal labs at the urgent care today. I do not feel these need to be repeated as he is only here for pain control and history well-appearing on exam.  ED PROGRESS: Patient reports complete resolution of pain after one dose of IM Toradol. Will discharge home with prescription for ibuprofen and Flomax. Will get urology followup. Patient reports he has 26 tablets of Percocet that were prescribed to him today. Given return precautions. Patient verbalizes understanding and is comfortable plan.    Russell Maw Ward, DO 09/13/13 2341

## 2013-09-18 ENCOUNTER — Encounter: Payer: Self-pay | Admitting: *Deleted

## 2013-09-20 ENCOUNTER — Other Ambulatory Visit: Payer: Self-pay | Admitting: Urology

## 2013-09-24 ENCOUNTER — Encounter (HOSPITAL_BASED_OUTPATIENT_CLINIC_OR_DEPARTMENT_OTHER): Payer: Self-pay | Admitting: *Deleted

## 2013-09-24 NOTE — Progress Notes (Signed)
PT STATES PASSED STONE , CALLED OFFICE THIS PAST Saturday 09-22-2013 .  GAVE PT OFFICE # AND OR SCHEDULER EXT . #  HE WANTS TO CX'D .

## 2013-09-25 ENCOUNTER — Encounter (HOSPITAL_BASED_OUTPATIENT_CLINIC_OR_DEPARTMENT_OTHER): Admission: RE | Payer: Self-pay | Source: Ambulatory Visit

## 2013-09-25 ENCOUNTER — Ambulatory Visit (HOSPITAL_BASED_OUTPATIENT_CLINIC_OR_DEPARTMENT_OTHER): Admission: RE | Admit: 2013-09-25 | Payer: Managed Care, Other (non HMO) | Source: Ambulatory Visit | Admitting: Urology

## 2013-09-25 HISTORY — DX: Mixed hyperlipidemia: E78.2

## 2013-09-25 HISTORY — DX: Other specified postprocedural states: Z98.890

## 2013-09-25 HISTORY — DX: Personal history of other diseases of the circulatory system: Z86.79

## 2013-09-25 HISTORY — DX: Gout, unspecified: M10.9

## 2013-09-25 HISTORY — DX: Personal history of transient ischemic attack (TIA), and cerebral infarction without residual deficits: Z86.73

## 2013-09-25 SURGERY — CYSTOURETEROSCOPY, WITH RETROGRADE PYELOGRAM AND STENT INSERTION
Anesthesia: General | Laterality: Left

## 2014-04-08 ENCOUNTER — Ambulatory Visit (INDEPENDENT_AMBULATORY_CARE_PROVIDER_SITE_OTHER): Payer: Managed Care, Other (non HMO) | Admitting: Nurse Practitioner

## 2014-04-08 ENCOUNTER — Encounter: Payer: Self-pay | Admitting: Nurse Practitioner

## 2014-04-08 VITALS — BP 126/66 | HR 71 | Ht 73.75 in | Wt 240.0 lb

## 2014-04-08 DIAGNOSIS — E782 Mixed hyperlipidemia: Secondary | ICD-10-CM

## 2014-04-08 DIAGNOSIS — I63219 Cerebral infarction due to unspecified occlusion or stenosis of unspecified vertebral arteries: Secondary | ICD-10-CM

## 2014-04-08 DIAGNOSIS — I10 Essential (primary) hypertension: Secondary | ICD-10-CM

## 2014-04-08 NOTE — Progress Notes (Addendum)
[facility]  PATIENT: LEVI KLAIBER DOB: 1956-01-19   REASON FOR VISIT: Followup for stroke   HISTORY OF PRESENT ILLNESS: Mr. Boeding, 58 year old male returns for followup.He has a history of right parietal infarct that occurred in February of 2007. He was initiated on Aggrenox and has done well since that time. He has continued to have paresthesias of the left index finger from time to time he claims he is tolerating the Aggrenox without any headaches or other side effects. He continues to have good blood pressure and lipid control he continues to play golf weekly. He denies double vision, loss of vision, focal weakness, speech or swallowing problems, confusions, blackouts. He returns for reevaluation. He denies any difficulty sleeping, he does not snore. Carotid Doppler April 2014 is negative for significant stenosis.   REVIEW OF SYSTEMS: Full 14 system review of systems performed and notable only for those listed, all others are neg:  Constitutional: N/A  Cardiovascular: N/A  Ear/Nose/Throat: N/A  Skin: N/A  Eyes: N/A  Respiratory: N/A  Gastroitestinal: N/A  Hematology/Lymphatic: N/A  Endocrine: N/A Musculoskeletal:N/A  Allergy/Immunology: N/A  Neurological: N/A Psychiatric: N/A Sleep : NA   ALLERGIES: No Known Allergies  HOME MEDICATIONS: Outpatient Prescriptions Prior to Visit  Medication Sig Dispense Refill  . allopurinol (ZYLOPRIM) 100 MG tablet Take 300 mg by mouth daily.       Marland Kitchen amLODipine-benazepril (LOTREL) 5-40 MG per capsule Take 1 capsule by mouth daily.      Marland Kitchen dipyridamole-aspirin (AGGRENOX) 200-25 MG per 12 hr capsule Take 1 capsule by mouth 2 (two) times daily.      Marland Kitchen ibuprofen (ADVIL,MOTRIN) 800 MG tablet Take 1 tablet (800 mg total) by mouth 3 (three) times daily.  21 tablet  0  . levothyroxine (SYNTHROID, LEVOTHROID) 50 MCG tablet Take 50 mcg by mouth daily.      . Multiple Vitamins-Minerals (MULTIVITAMINS THER. W/MINERALS)  TABS Take 1 tablet by mouth daily.      . tamsulosin (FLOMAX) 0.4 MG CAPS capsule Take 1 capsule (0.4 mg total) by mouth daily.  30 capsule  0  . niacin-lovastatin (ADVICOR) 1000-20 MG 24 hr tablet Take 1 tablet by mouth at bedtime.       No facility-administered medications prior to visit.    PAST MEDICAL HISTORY: Past Medical History  Diagnosis Date  . HTN (hypertension)   . Hypothyroid   . 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 History of cerebral artery occlusion     FEB 2007  OCCLUSION/ STENOSIS VERTEBRAL ARTERY W/ INFARCT  . History of esophageal dilatation   . Arthritis, gouty   . Mixed hyperlipidemia     PAST SURGICAL HISTORY: Past Surgical History  Procedure Laterality Date  . Esophagogastroduodenoscopy  07/04/2003    RMR: Multiple linear esophageal erosions consistent with mild-to-moderate  erosive reflux esophagitis, status post passage of a 14 F Maloney dilator/ Multiple antral and duodenal bulbar erosions; otherwise, the remainder of the stomach and the first and second portions of the duodenum appeared normal  . Colonoscopy    07/04/2003    RMR: anal canal hemorrhoids, normal colon  . Colonoscopy  11/02/2012    Procedure: COLONOSCOPY;  Surgeon: Daneil Dolin, MD;  Location: [facility];  Service: Endoscopy;  Laterality: N/A;  9:30    FAMILY HISTORY: Family History  Problem Relation Age of Onset  . Colon cancer  Sister 87    SOCIAL HISTORY: History   Social History  . Marital Status: Married    Spouse Name: [name]     Number of Children: 3  . Years of Education: 12   Occupational History  . 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 Smoking status: Never Smoker   . Smokeless tobacco: Never Used  . Alcohol Use: No  . Drug Use: No  . Sexual Activity: Not on file   Other Topics Concern  .  Not on file   Social History Narrative   Patient lives at home with wife [name].    Patient has 3 daughters.    Patient has a high school education.    Patient is right haneded.    Patient is working at [employer].      PHYSICAL EXAM  Filed Vitals:   04/08/14 1349  BP: 126/66  Pulse: 71  Height: 6' 1.75" (1.873 m)  Weight: 240 lb (108.863 kg)   Body mass index is 31.03 kg/(m^2). General: well developed, mildly obese male  seated, in no evident distress  Head: head normocephalic and atraumatic. Oropharynx benign  Neck: supple with no carotid or supraclavicular bruits  Cardiovascular: regular rate and rhythm, no murmurs  Neurologic Exam  Mental Status: Awake and fully alert. Oriented to place and time. Recent and remote memory intact. Attention span, concentration and fund of knowledge appropriate. Mood and affect appropriate.  Cranial Nerves: Pupils equal, briskly reactive to light. Extraocular movements full without nystagmus. Visual fields full to confrontation. Hearing intact and symmetric to finger snap. Facial sensation intact. Face, tongue, palate move normally and symmetrically. Neck flexion and extension normal.  Motor: Normal bulk and tone. Normal strength in all tested extremity muscles.  Sensory.: intact to touch and pinprick and vibratory.  Coordination: Rapid alternating movements normal in all extremities. Finger-to-nose and heel-to-shin performed accurately bilaterally.  Gait and Station: Arises from chair without difficulty. Stance is normal. Gait demonstrates normal stride length and balance . Able to heel, toe and tandem walk without difficulty.  Reflexes: 2+ and symmetric. Toes downgoing.    DIAGNOSTIC DATA (LABS, IMAGING, TESTING)  ASSESSMENT AND PLAN  58 y.o. year old male  has a past medical history of HTN (hypertension); History of right parietal infarct February 2007  (RESIDUAL LEFT INDEX FINGER PARESTHESIS); Mixed hyperlipidemia. here to followup.  He has not had further stroke or TIA symptoms. Most recent carotid Doppler April 2014 was negative for significant stenosis. He is a patient of Dr. Leonie Man  who is out of the office.  Continue Aggrenox for secondary stroke prevention Keep Blood pressure less than 828 systolic,  todays reading 126/66  Cholesterol less than 200 and LDL less than 100 followed by PCP Followup yearly and when necessary Dennie Bible, [facility], [facility], APRN  [facility] [address], [place], [place] [address] [phone]  I reviewed the above note and documentation by the Nurse Practitioner and agree with the history, physical exam, assessment and plan as outlined above. Star Age, MD, PhD [facility])

## 2014-04-08 NOTE — Patient Instructions (Addendum)
Continue Aggrenox for secondary stroke prevention Blood pressure less than 601 systolic todays reading 561/53  Cholesterol less than 200 and LDL less than 100 Carotid Doppler April 2014 was negative for significant stenosis Followup yearly and when necessary

## 2015-04-15 ENCOUNTER — Ambulatory Visit: Payer: Managed Care, Other (non HMO) | Admitting: Nurse Practitioner

## 2015-04-16 ENCOUNTER — Encounter: Payer: Self-pay | Admitting: Nurse Practitioner

## 2015-04-16 ENCOUNTER — Ambulatory Visit (INDEPENDENT_AMBULATORY_CARE_PROVIDER_SITE_OTHER): Payer: 59 | Admitting: Nurse Practitioner

## 2015-04-16 VITALS — BP 125/77 | HR 66 | Ht 73.0 in | Wt 241.4 lb

## 2015-04-16 DIAGNOSIS — I63219 Cerebral infarction due to unspecified occlusion or stenosis of unspecified vertebral arteries: Secondary | ICD-10-CM

## 2015-04-16 DIAGNOSIS — I639 Cerebral infarction, unspecified: Secondary | ICD-10-CM | POA: Diagnosis not present

## 2015-04-16 DIAGNOSIS — I10 Essential (primary) hypertension: Secondary | ICD-10-CM | POA: Diagnosis not present

## 2015-04-16 NOTE — Progress Notes (Signed)
I agree with the above plan 

## 2015-04-16 NOTE — Progress Notes (Signed)
[facility]  PATIENT: Russell Odom DOB: [birth year]   REASON FOR VISIT: Follow-up for history of stroke  HISTORY FROM: Patient and wife    HISTORY OF PRESENT ILLNESS:Mr. 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 He was initiated on Aggrenox and has done well since that time. He has continued to have paresthesias of the left index finger from time to time he claims he is tolerating the Aggrenox without any headaches or other side effects. He continues to have good blood pressure and lipid control he continues to play golf weekly. He denies double vision, loss of vision, focal weakness, speech or swallowing problems, confusions, blackouts. He returns for reevaluation. He denies any difficulty sleeping, he does not snore. Last Carotid Doppler April 2014 is negative for significant stenosis.    REVIEW OF SYSTEMS: Full 14 system review of systems performed and notable only for those listed, all others are neg:  Constitutional: neg  Cardiovascular: neg Ear/Nose/Throat: neg  Skin: neg Eyes: neg Respiratory: neg Gastroitestinal: neg  Hematology/Lymphatic: neg  Endocrine: neg Musculoskeletal:neg Allergy/Immunology: neg Neurological: neg Psychiatric: neg Sleep : neg   ALLERGIES: No Known Allergies  HOME MEDICATIONS: Outpatient Prescriptions Prior to Visit  Medication Sig Dispense Refill  . amLODipine-benazepril (LOTREL) 5-40 MG per capsule Take 1 capsule by mouth daily.    Marland Kitchen dipyridamole-aspirin (AGGRENOX) 200-25 MG per 12 hr capsule Take 1 capsule by mouth 2 (two) times daily.    Marland Kitchen ibuprofen (ADVIL,MOTRIN) 800 MG tablet Take 1 tablet (800 mg total) by mouth 3 (three) times daily. (Patient taking differently: Take 800 mg by mouth every 8 (eight) hours as needed. ) 21 tablet 0  . indomethacin (INDOCIN) 50 MG capsule Take 50 mg by mouth daily.     Marland Kitchen levothyroxine (SYNTHROID,  LEVOTHROID) 50 MCG tablet Take 50 mcg by mouth daily.    Marland Kitchen lovastatin (MEVACOR) 40 MG tablet Take 40 mg by mouth at bedtime.     . Multiple Vitamins-Minerals (MULTIVITAMINS THER. W/MINERALS) TABS Take 1 tablet by mouth daily.    . niacin 500 MG tablet Take 500 mg by mouth at bedtime.    . tamsulosin (FLOMAX) 0.4 MG CAPS capsule Take 1 capsule (0.4 mg total) by mouth daily. 30 capsule 0  . allopurinol (ZYLOPRIM) 100 MG tablet Take 300 mg by mouth daily.      No facility-administered medications prior to visit.    PAST MEDICAL HISTORY: Past Medical History  Diagnosis Date  . HTN (hypertension)   . Hypothyroid   . 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 History of cerebral artery occlusion     FEB 2007  OCCLUSION/ STENOSIS VERTEBRAL ARTERY W/ INFARCT  . History of esophageal dilatation   . Arthritis, gouty   . Mixed hyperlipidemia     PAST SURGICAL HISTORY: Past Surgical History  Procedure Laterality Date  . Esophagogastroduodenoscopy  07/04/2003    RMR: Multiple linear esophageal erosions consistent with mild-to-moderate  erosive reflux esophagitis, status post passage of a 25 F Maloney dilator/ Multiple antral and duodenal bulbar erosions; otherwise, the remainder of the stomach and the first and second portions of the duodenum appeared normal  . Colonoscopy    07/04/2003    RMR: anal canal hemorrhoids, normal colon  . Colonoscopy  11/02/2012    Procedure: COLONOSCOPY;  Surgeon: Herbie Baltimore  Hilton Cork, MD;  [facility];  Service: Endoscopy;  Laterality: N/A;  9:30    FAMILY HISTORY: Family History  Problem Relation Age of Onset  . Colon cancer Sister 20    SOCIAL HISTORY: History   Social History  . Marital Status: Married    Spouse Name: Russell Odom   . Number of Children: 3  . Years of Education: 12   Occupational History  . 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 Smoking status: Never Smoker   . Smokeless tobacco: Never Used  . Alcohol Use: No  . Drug Use: No  . Sexual Activity: Not on file   Other Topics Concern  . Not on file   Social History Narrative   Patient lives at home with wife Russell Odom.    Patient has 3 daughters.    Patient has a high school education.    Patient is right haneded.    Patient is working at [employer].      PHYSICAL EXAM  Filed Vitals:   04/16/15 1512  BP: 125/77  Pulse: 66  Height: 6\' 1"  (1.854 m)  Weight: 241 lb 6.4 oz (109.498 kg)   Body mass index is 31.86 kg/(m^2). General: well developed, mildly obese male seated, in no evident distress  Head: head normocephalic and atraumatic. Oropharynx benign  Neck: supple with no carotid or supraclavicular bruits  Cardiovascular: regular rate and rhythm, no murmurs  Neurologic Exam  Mental Status: Awake and fully alert. Oriented to place and time. Recent and remote memory intact. Attention span, concentration and fund of knowledge appropriate. Mood and affect appropriate.  Cranial Nerves: Pupils equal, briskly reactive to light. Extraocular movements full without nystagmus. Visual fields full to confrontation. Hearing intact and symmetric to finger snap. Facial sensation intact. Face, tongue, palate move normally and symmetrically. Neck flexion and extension normal.  Motor: Normal bulk and tone. Normal strength in all tested extremity muscles.  Sensory.: intact to touch and pinprick and vibratory.  Coordination: Rapid alternating movements normal in all extremities. Finger-to-nose and heel-to-shin performed accurately bilaterally.  Gait and Station: Arises from chair without difficulty. Stance is normal. Gait demonstrates normal stride length and balance . Able to heel, toe and tandem walk without difficulty.  Reflexes: 2+ and symmetric. Toes downgoing.  DIAGNOSTIC DATA (LABS, IMAGING,  TESTING) -ASSESSMENT AND PLAN  59 y.o. year old male  has a past medical history of HTN (hypertension);  History of CVA (cerebrovascular accident) (RESIDUAL LEFT INDEX FINGER PARESTHESIS); History of cerebral artery occlusion;  Arthritis, gouty; and Mixed hyperlipidemia. here to follow-up for his history of stroke. No further stroke or TIA symptoms  Continue Aggrenox for secondary stroke prevention Keep blood pressure less than 628 systolic, today's reading 125/77 Cholesterol less than 200,  LDL less than 100 followed by primary care Moderate exercise for overall health and well-being Follow-up yearly and when necessary Dennie Bible, [facility], [facility], APRN  [facility] [address], [place], [place] [address] [phone]

## 2015-04-16 NOTE — Patient Instructions (Signed)
Continue Aggrenox for secondary stroke prevention Keep blood pressure less than 659 systolic, today's reading 125/77 Cholesterol less than 200,  LDL less than 100 followed by primary care Moderate exercise for overall health and well-being Follow-up yearly and when necessary

## 2016-04-15 ENCOUNTER — Encounter: Payer: Self-pay | Admitting: Nurse Practitioner

## 2016-04-15 ENCOUNTER — Ambulatory Visit (INDEPENDENT_AMBULATORY_CARE_PROVIDER_SITE_OTHER): Payer: 59 | Admitting: Nurse Practitioner

## 2016-04-15 VITALS — BP 118/78 | HR 72 | Ht 73.0 in | Wt 239.4 lb

## 2016-04-15 DIAGNOSIS — I10 Essential (primary) hypertension: Secondary | ICD-10-CM

## 2016-04-15 DIAGNOSIS — I63219 Cerebral infarction due to unspecified occlusion or stenosis of unspecified vertebral arteries: Secondary | ICD-10-CM | POA: Diagnosis not present

## 2016-04-15 NOTE — Progress Notes (Signed)
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 He was initiated on Aggrenox and has done well since that time. He has continued to have paresthesias of the left index finger from time to time he claims he is tolerating the Aggrenox without any headaches or other side effects. He continues to have good blood pressure,  he continues to play golf weekly. He denies double vision, loss of vision, focal weakness, speech or swallowing problems, confusions, blackouts. He returns for reevaluation. Lipids followed by PCP recently done and OK per patient. I do not have access to those records. He denies any difficulty sleeping, he does not snore. Last Carotid Doppler April 2014 is negative for significant stenosis   REVIEW OF SYSTEMS: Full 14 system review of systems performed and notable only for those listed, all others are neg:  Constitutional: neg  Cardiovascular: neg Ear/Nose/Throat: neg  Skin: neg Eyes: neg Respiratory: neg Gastroitestinal: neg  Hematology/Lymphatic: neg  Endocrine: neg Musculoskeletal:neg Allergy/Immunology: neg Neurological: neg Psychiatric: neg Sleep : neg   ALLERGIES: No Known Allergies  HOME MEDICATIONS: Outpatient Prescriptions Prior to Visit  Medication Sig Dispense Refill  . allopurinol (ZYLOPRIM) 300 MG tablet Take 300 mg by mouth daily.  6  . amLODipine-benazepril (LOTREL) 5-40 MG per capsule Take 1 capsule by mouth daily.    Marland Kitchen dipyridamole-aspirin (AGGRENOX) 200-25 MG per 12 hr capsule Take 1 capsule by mouth 2 (two) times daily.    Marland Kitchen levothyroxine (SYNTHROID, LEVOTHROID) 50 MCG tablet Take 50 mcg by mouth daily.    Marland Kitchen lovastatin (MEVACOR) 40 MG tablet  Take 40 mg by mouth at bedtime.     . Multiple Vitamins-Minerals (MULTIVITAMINS THER. W/MINERALS) TABS Take 1 tablet by mouth daily.    . niacin 500 MG tablet Take 500 mg by mouth at bedtime.    Marland Kitchen amoxicillin (AMOXIL) 875 MG tablet Reported on 04/15/2016  0  . ibuprofen (ADVIL,MOTRIN) 800 MG tablet Take 1 tablet (800 mg total) by mouth 3 (three) times daily. (Patient not taking: Reported on 04/15/2016) 21 tablet 0  . indomethacin (INDOCIN) 50 MG capsule Take 50 mg by mouth daily. Reported on 04/15/2016    . predniSONE (STERAPRED UNI-PAK 21 TAB) 5 MG (21) TBPK tablet See admin instructions. Reported on 04/15/2016  0  . promethazine-dextromethorphan (PROMETHAZINE-DM) 6.25-15 MG/5ML syrup Reported on 04/15/2016  0  . tamsulosin (FLOMAX) 0.4 MG CAPS capsule Take 1 capsule (0.4 mg total) by mouth daily. (Patient not taking: Reported on 04/15/2016) 30 capsule 0   No facility-administered medications prior to visit.    PAST MEDICAL HISTORY: Past Medical History  Diagnosis Date  . HTN (hypertension)   . Hypothyroid   . 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 History of cerebral artery occlusion     FEB 2007  OCCLUSION/ STENOSIS VERTEBRAL ARTERY W/ INFARCT  . History of esophageal dilatation   . Arthritis, gouty   . Mixed hyperlipidemia     PAST SURGICAL HISTORY: Past Surgical History  Procedure Laterality Date  . Esophagogastroduodenoscopy  07/04/2003    RMR: Multiple linear esophageal erosions consistent with mild-to-moderate  erosive reflux esophagitis, status  post passage of a 32 F Maloney dilator/ Multiple antral and duodenal bulbar erosions; otherwise, the remainder of the stomach and the first and second portions of the duodenum appeared normal  . Colonoscopy    07/04/2003    RMR: anal canal hemorrhoids, normal colon  . Colonoscopy  11/02/2012    Procedure: COLONOSCOPY;   Surgeon: Daneil Dolin, MD;  [facility];  Service: Endoscopy;  Laterality: N/A;  9:30    FAMILY HISTORY: Family History  Problem Relation Age of Onset  . Colon cancer Sister 83    SOCIAL HISTORY: Social History   Social History  . Marital Status: Married    Spouse Name: [name]   . Number of Children: 3  . Years of Education: 12   Occupational History  . 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 Smoking status: Never Smoker   . Smokeless tobacco: Never Used  . Alcohol Use: No  . Drug Use: No  . Sexual Activity: Not on file   Other Topics Concern  . Not on file   Social History Narrative   Patient lives at home with wife [name].    Patient has 3 daughters.    Patient has a high school education.    Patient is right haneded.    Patient is working at [employer].      PHYSICAL EXAM  Filed Vitals:   04/15/16 1533  BP: 118/78  Pulse: 72  Height: 6\' 1"  (1.854 m)  Weight: 239 lb 6.4 oz (108.591 kg)   Body mass index is 31.59 kg/(m^2). General: well developed, mildly obese male seated, in no evident distress  Head: head normocephalic and atraumatic. Oropharynx benign  Neck: supple with no carotid or supraclavicular bruits  Cardiovascular: regular rate and rhythm, no murmurs  Neurologic Exam  Mental Status: Awake and fully alert. Oriented to place and time. Recent and remote memory intact. Attention span, concentration and fund of knowledge appropriate. Mood and affect appropriate.  Cranial Nerves: Pupils equal, briskly reactive to light. Extraocular movements full without nystagmus. Visual fields full to confrontation. Hearing intact and symmetric to finger snap. Facial sensation intact. Face, tongue, palate move normally and symmetrically. Neck flexion and extension normal.  Motor: Normal bulk and tone. Normal strength in all tested extremity muscles.  Sensory.: intact to touch and pinprick and  vibratory.  Coordination: Rapid alternating movements normal in all extremities. Finger-to-nose and heel-to-shin performed accurately bilaterally.  Gait and Station: Arises from chair without difficulty. Stance is normal. Gait demonstrates normal stride length and balance . Able to heel, toe and tandem walk without difficulty.  Reflexes: 2+ and symmetric. Toes downgoing.  DIAGNOSTIC DATA (LABS, IMAGING, TESTING)  ASSESSMENT AND PLAN 60 y.o. year old male has a past medical history of HTN (hypertension); History of CVA (cerebrovascular accident) (RESIDUAL LEFT INDEX FINGER PARESTHESIS); History of cerebral artery occlusion; Arthritis, gouty; and Mixed hyperlipidemia. here to follow-up for his history of stroke. No further stroke or TIA symptoms since 2007   PLAN:Keep systolic blood pressure less than 130, today's reading 118/78 Lipids are followed by PCP Continue Niacin Will repeat Carotid Doppler last in 2014 was negative for stenosis Continue Aggrenox for  secondary stroke prevention obtains from PCP No further stroke or TIA symptoms since 2007 If recurrent stroke symptoms occur, call 911 and proceed to the hospital Discharge from neurologic services at this time Dennie Bible, [facility], [facility], [facility] [address] [place],  [address] [phone]

## 2016-04-15 NOTE — Patient Instructions (Signed)
Keep systolic blood pressure less than 130, today's reading 118/78 Lipids are followed by PCP Continue Niacin Will repeat Carotid Doppler last in 2014 was negative for stenosis Continue Aggrenox for  secondary stroke prevention No further stroke or TIA symptoms since 2007 If recurrent stroke symptoms occur, call 911 and proceed to the hospital Discharge from neurologic services at this time

## 2016-04-28 ENCOUNTER — Ambulatory Visit (INDEPENDENT_AMBULATORY_CARE_PROVIDER_SITE_OTHER): Payer: 59

## 2016-04-28 DIAGNOSIS — I63219 Cerebral infarction due to unspecified occlusion or stenosis of unspecified vertebral arteries: Secondary | ICD-10-CM

## 2016-05-03 ENCOUNTER — Telehealth: Payer: Self-pay | Admitting: Nurse Practitioner

## 2016-05-03 NOTE — Telephone Encounter (Signed)
Carotid Doppler is negative for hemodynamically significant stenosis involving the extracranial carotid arteries bilaterally. Please call patient

## 2016-05-03 NOTE — Telephone Encounter (Signed)
LMVM for pt to return call for carotid doppler study results.

## 2016-05-04 NOTE — Telephone Encounter (Signed)
Patient is returning your call about his carotid doppler results.  Please call. Thanks!

## 2016-05-04 NOTE — Telephone Encounter (Signed)
Called patient back left message CUS is negative for significant stenosis

## 2017-09-22 ENCOUNTER — Encounter: Payer: Self-pay | Admitting: Internal Medicine

## 2017-12-12 ENCOUNTER — Ambulatory Visit (INDEPENDENT_AMBULATORY_CARE_PROVIDER_SITE_OTHER): Payer: Self-pay

## 2017-12-12 DIAGNOSIS — Z86010 Personal history of colonic polyps: Secondary | ICD-10-CM

## 2017-12-12 MED ORDER — PEG 3350-KCL-NA BICARB-NACL 420 G PO SOLR: 4000.0000 mL | ORAL | 0 refills | Status: DC

## 2017-12-12 NOTE — Patient Instructions (Signed)
Russell Odom   July 12, 1956 MRN: [id number]    Procedure Date: 02/01/18 Time to register: 8:45 Place to register: [facility] Procedure Time: 9:45 Scheduled provider: R. Garfield Cornea, MD  PREPARATION FOR COLONOSCOPY WITH TRI-LYTE SPLIT PREP  Please notify us immediately if you are diabetic, take iron supplements, or if you are on Coumadin or any other blood thinners.     You will need to purchase 1 fleet enema and 1 box of Bisacodyl 4m tablets.   2 DAYS BEFORE PROCEDURE:  DATE: 01/30/18   DAY: Monday Begin clear liquid diet AFTER your lunch meal. NO SOLID FOODS after this point.  1 DAY BEFORE PROCEDURE:  DATE: 01/31/18   DAY: Tuesday Continue clear liquids the entire day - NO SOLID FOOD.     At 2:00 pm:  Take 2 Bisacodyl tablets.   At 4:00pm:  Start drinking your solution. Make sure you mix well per instructions on the bottle. Try to drink 1 (one) 8 ounce glass every 10-15 minutes until you have consumed HALF the jug. You should complete by 6:00pm.You must keep the left over solution refrigerated until completed next day.  Continue clear liquids. You must drink plenty of clear liquids to prevent dehyration and kidney failure. Nothing to eat or drink after midnight.  EXCEPTION: If you take medications for your heart, blood pressure or breathing, you may take these medications with a small amount of clear liquid.    DAY OF PROCEDURE:   DATE: 02/01/18   DAY: Wednesday    Five hours before your procedure time @ 4:45am:  Finish remaining amout of bowel prep, drinking 1 (one) 8 ounce glass every 10-15 minutes until complete. You have two hours to consume remaining prep.   Three hours before your procedure time _0 :45am:  Nothing by mouth.   At least one hour before going to the hospital:  Give yourself one Fleet enema. You may take your morning medications with sip of water unless we have instructed otherwise.      Please see below for Dietary  Information.  CLEAR LIQUIDS INCLUDE:  Water Jello (NOT red in color)   Ice Popsicles (NOT red in color)   Tea (sugar ok, no milk/cream) Powdered fruit flavored drinks  Coffee (sugar ok, no milk/cream) Gatorade/ Lemonade/ Kool-Aid  (NOT red in color)   Juice: apple, white grape, white cranberry Soft drinks  Clear bullion, consomme, broth (fat free beef/chicken/vegetable)  Carbonated beverages (any kind)  Strained chicken noodle soup Hard Candy   Remember: Clear liquids are liquids that will allow you to see your fingers on the other side of a clear glass. Be sure liquids are NOT red in color, and not cloudy, but CLEAR.  DO NOT EAT OR DRINK ANY OF THE FOLLOWING:  Dairy products of any kind   Cranberry juice Tomato juice / V8 juice   Grapefruit juice Orange juice     Red grape juice  Do not eat any solid foods, including such foods as: cereal, oatmeal, yogurt, fruits, vegetables, creamed soups, eggs, bread, crackers, pureed foods in a blender, etc.   HELPFUL HINTS FOR DRINKING PREP SOLUTION:   Make sure prep is extremely cold. Mix and refrigerate the the morning of the prep. You may also put in the freezer.   You may try mixing some Crystal Light or Country Time Lemonade if you prefer. Mix in small amounts; add more if necessary.  Try drinking through a straw  Rinse mouth with water or a mouthwash  between glasses, to remove after-taste.  Try sipping on a cold beverage /ice/ popsicles between glasses of prep.  Place a piece of sugar-free hard candy in mouth between glasses.  If you become nauseated, try consuming smaller amounts, or stretch out the time between glasses. Stop for 30-60 minutes, then slowly start back drinking.        OTHER INSTRUCTIONS  You will need a responsible adult at least 64 years of age to accompany you and drive you home. This person must remain in the waiting room during your procedure. The hospital will cancel your procedure if you do not have a  responsible adult with you.   1. Wear loose fitting clothing that is easily removed. 2. Leave jewelry and other valuables at home.  3. Remove all body piercing jewelry and leave at home. 4. Total time from sign-in until discharge is approximately 2-3 hours. 5. You should go home directly after your procedure and rest. You can resume normal activities the day after your procedure. 6. The day of your procedure you should not:  Drive  Make legal decisions  Operate machinery  Drink alcohol  Return to work   You may call the office (Dept: [phone]) before 5:00pm, or page the doctor on call [phone]) after 5:00pm, for further instructions, if necessary.   Insurance Information YOU WILL NEED TO CHECK WITH YOUR INSURANCE COMPANY FOR THE BENEFITS OF COVERAGE YOU HAVE FOR THIS PROCEDURE.  UNFORTUNATELY, NOT ALL INSURANCE COMPANIES HAVE BENEFITS TO COVER ALL OR PART OF THESE TYPES OF PROCEDURES.  IT IS YOUR RESPONSIBILITY TO CHECK YOUR BENEFITS, HOWEVER, WE WILL BE GLAD TO ASSIST YOU WITH ANY CODES YOUR INSURANCE COMPANY MAY NEED.    PLEASE NOTE THAT MOST INSURANCE COMPANIES WILL NOT COVER A SCREENING COLONOSCOPY FOR PEOPLE UNDER THE AGE OF 50  IF YOU HAVE BCBS INSURANCE, YOU MAY HAVE BENEFITS FOR A SCREENING COLONOSCOPY BUT IF POLYPS ARE FOUND THE DIAGNOSIS WILL CHANGE AND THEN YOU MAY HAVE A DEDUCTIBLE THAT WILL NEED TO BE MET. SO PLEASE MAKE SURE YOU CHECK YOUR BENEFITS FOR A SCREENING COLONOSCOPY AS WELL AS A DIAGNOSTIC COLONOSCOPY.

## 2017-12-12 NOTE — Progress Notes (Signed)
Gastroenterology Pre-Procedure Review  Request Date:12/12/17 Requesting Physician: 5 year recall Dr.Rourk- last tcs 11/02/12 adenomatous polyp  PATIENT REVIEW QUESTIONS: The patient responded to the following health history questions as indicated:    1. Diabetes Melitis: no 2. Joint replacements in the past 12 months: no 3. Major health problems in the past 3 months: no 4. Has an artificial valve or MVP: no 5. Has a defibrillator: no 6. Has been advised in past to take antibiotics in advance of a procedure like teeth cleaning: no 7. Family history of colon cancer: yes (sister)  10. Alcohol Use: no 9. History of sleep apnea: no  10. History of coronary artery or other vascular stents placed within the last 12 months: no 11. History of any prior anesthesia complications: no    MEDICATIONS & ALLERGIES:    Patient reports the following regarding taking any blood thinners:   Plavix? no Aspirin? no Coumadin? no Brilinta? no Xarelto? no Eliquis? no Pradaxa? no Savaysa? no Effient? no  Patient confirms/reports the following medications:  Current Outpatient Medications  Medication Sig Dispense Refill  . allopurinol (ZYLOPRIM) 300 MG tablet Take 300 mg by mouth daily.  6  . amLODipine-benazepril (LOTREL) 5-40 MG per capsule Take 1 capsule by mouth daily.    Marland Kitchen dipyridamole-aspirin (AGGRENOX) 200-25 MG per 12 hr capsule Take 1 capsule by mouth 2 (two) times daily.    Marland Kitchen levothyroxine (SYNTHROID, LEVOTHROID) 50 MCG tablet Take 50 mcg by mouth daily.    Marland Kitchen lovastatin (MEVACOR) 40 MG tablet Take 40 mg by mouth at bedtime.     . Multiple Vitamins-Minerals (MULTIVITAMINS THER. W/MINERALS) TABS Take 1 tablet by mouth daily.    . niacin 500 MG tablet Take 500 mg by mouth at bedtime.    . Omega-3 Fatty Acids (OMEGA-3 FISH OIL) 500 MG CAPS Take by mouth.     No current facility-administered medications for this visit.     Patient confirms/reports the following allergies:  No Known Allergies  No  orders of the defined types were placed in this encounter.   AUTHORIZATION INFORMATION Primary Insurance: Unknown Jim #: [id number] Pre-Cert / Josem Kaufmann required: no   SCHEDULE INFORMATION: Procedure has been scheduled as follows:  Date:02/01/18 , Time: 9:45 Location: [facility] Dr.Rourk  This Gastroenterology Pre-Precedure Review Form is being routed to the following provider(s): Walden Field NP

## 2017-12-14 NOTE — Progress Notes (Signed)
Ok to schedule.

## 2018-02-01 ENCOUNTER — Encounter (HOSPITAL_COMMUNITY)
Admission: RE | Disposition: A | Discharge status: Home / Self Care | Payer: Self-pay | Source: Ambulatory Visit | Attending: Internal Medicine

## 2018-02-01 ENCOUNTER — Encounter (HOSPITAL_COMMUNITY): Payer: Self-pay | Admitting: *Deleted

## 2018-02-01 ENCOUNTER — Ambulatory Visit (HOSPITAL_COMMUNITY)
Admission: RE | Admit: 2018-02-01 | Discharge: 2018-02-01 | Disposition: A | Discharge status: Home / Self Care | Payer: 59 | Source: Ambulatory Visit | Attending: Internal Medicine | Admitting: Internal Medicine

## 2018-02-01 ENCOUNTER — Other Ambulatory Visit: Payer: Self-pay

## 2018-02-01 DIAGNOSIS — Z860100 Personal history of colon polyps, unspecified: Secondary | ICD-10-CM

## 2018-02-01 DIAGNOSIS — M109 Gout, unspecified: Secondary | ICD-10-CM | POA: Diagnosis not present

## 2018-02-01 DIAGNOSIS — Z800 Family history of malignant neoplasm of digestive organs: Secondary | ICD-10-CM | POA: Insufficient documentation

## 2018-02-01 DIAGNOSIS — Z1211 Encounter for screening for malignant neoplasm of colon: Secondary | ICD-10-CM | POA: Insufficient documentation

## 2018-02-01 DIAGNOSIS — I10 Essential (primary) hypertension: Secondary | ICD-10-CM | POA: Diagnosis not present

## 2018-02-01 DIAGNOSIS — Z8673 Personal history of transient ischemic attack (TIA), and cerebral infarction without residual deficits: Secondary | ICD-10-CM | POA: Insufficient documentation

## 2018-02-01 DIAGNOSIS — K6389 Other specified diseases of intestine: Secondary | ICD-10-CM | POA: Diagnosis not present

## 2018-02-01 DIAGNOSIS — K5730 Diverticulosis of large intestine without perforation or abscess without bleeding: Secondary | ICD-10-CM | POA: Diagnosis not present

## 2018-02-01 DIAGNOSIS — E039 Hypothyroidism, unspecified: Secondary | ICD-10-CM | POA: Insufficient documentation

## 2018-02-01 DIAGNOSIS — Z7982 Long term (current) use of aspirin: Secondary | ICD-10-CM | POA: Diagnosis not present

## 2018-02-01 DIAGNOSIS — Z79899 Other long term (current) drug therapy: Secondary | ICD-10-CM | POA: Insufficient documentation

## 2018-02-01 DIAGNOSIS — E782 Mixed hyperlipidemia: Secondary | ICD-10-CM | POA: Diagnosis not present

## 2018-02-01 HISTORY — PX: BIOPSY: SHX5522

## 2018-02-01 HISTORY — PX: COLONOSCOPY: SHX5424

## 2018-02-01 SURGERY — COLONOSCOPY
Anesthesia: Moderate Sedation

## 2018-02-01 MED ORDER — ONDANSETRON HCL 4 MG/2ML IJ SOLN
INTRAMUSCULAR | Status: DC | PRN
  Administered 2018-02-01: 4 mg via INTRAVENOUS

## 2018-02-01 MED ORDER — SODIUM CHLORIDE 0.9 % IV SOLN
INTRAVENOUS | Status: DC
  Administered 2018-02-01: 09:00:00 via INTRAVENOUS

## 2018-02-01 MED ORDER — STERILE WATER FOR IRRIGATION IR SOLN
Status: DC | PRN
  Administered 2018-02-01: 2.5 mL

## 2018-02-01 MED ORDER — MIDAZOLAM HCL 5 MG/5ML IJ SOLN
INTRAMUSCULAR | Status: AC
  Filled 2018-02-01: qty 10

## 2018-02-01 MED ORDER — MIDAZOLAM HCL 5 MG/5ML IJ SOLN
INTRAMUSCULAR | Status: DC | PRN
  Administered 2018-02-01 (×2): 2 mg via INTRAVENOUS

## 2018-02-01 MED ORDER — ONDANSETRON HCL 4 MG/2ML IJ SOLN
INTRAMUSCULAR | Status: AC
  Filled 2018-02-01: qty 2

## 2018-02-01 MED ORDER — MEPERIDINE HCL 100 MG/ML IJ SOLN
INTRAMUSCULAR | Status: AC
  Filled 2018-02-01: qty 2

## 2018-02-01 MED ORDER — MEPERIDINE HCL 100 MG/ML IJ SOLN
INTRAMUSCULAR | Status: DC | PRN
  Administered 2018-02-01 (×2): 50 mg via INTRAVENOUS

## 2018-02-01 NOTE — H&P (Signed)
@LOGO @   Primary Care Physician:  Sofie Rower, PA-C Primary Gastroenterologist:  Dr.   Pre-Procedure History & Physical: HPI:  Russell Odom is a 62 y.o. male here for here for surveillance colonoscopy. History of colonic adenoma removed 5 years ago. Positive family history of colon cancer in patient's sister diagnosed at age 91. No bowel symptoms currently.  Past Medical History:  Diagnosis Date  . Arthritis, gouty   . 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 History of esophageal dilatation   . HTN (hypertension)   . Hypothyroid   . Mixed hyperlipidemia     Past Surgical History:  Procedure Laterality Date  . COLONOSCOPY    07/04/2003   RMR: anal canal hemorrhoids, normal colon  . COLONOSCOPY  11/02/2012   Procedure: COLONOSCOPY;  Surgeon: Daneil Dolin, MD;  Location: [facility];  Service: Endoscopy;  Laterality: N/A;  9:30  . ESOPHAGOGASTRODUODENOSCOPY  07/04/2003   RMR: Multiple linear esophageal erosions consistent with mild-to-moderate  erosive reflux esophagitis, status post passage of a 3 F Maloney dilator/ Multiple antral and duodenal bulbar erosions; otherwise, the remainder of the stomach and the first and second portions of the duodenum appeared normal    Prior to Admission medications   Medication Sig Start Date End Date Taking? Authorizing Provider  allopurinol (ZYLOPRIM) 300 MG tablet Take 300 mg by mouth daily. 04/01/15  Yes [provider]  amLODipine-benazepril (LOTREL) 5-40 MG per capsule Take 1 capsule by mouth daily.   Yes [provider]  dipyridamole-aspirin (AGGRENOX) 200-25 MG per 12 hr capsule Take 1 capsule by mouth 2 (two) times daily.   Yes [provider]  levothyroxine (SYNTHROID,  LEVOTHROID) 50 MCG tablet Take 50 mcg by mouth daily before breakfast.    Yes [provider]  lovastatin (MEVACOR) 40 MG tablet Take 40 mg by mouth at bedtime.  03/26/14  Yes [provider]  MEGARED OMEGA-3 KRILL OIL 500 MG CAPS Take 500 mg by mouth at bedtime.   Yes [provider]  Multiple Vitamins-Minerals (MULTIVITAMINS THER. W/MINERALS) TABS Take 1 tablet by mouth daily.   Yes [provider]  niacin 500 MG tablet Take 500 mg by mouth at bedtime.   Yes [provider]  polyethylene glycol-electrolytes (TRILYTE) 420 g solution Take 4,000 mLs by mouth as directed. 12/12/17  Yes Carlis Stable, NP    Allergies as of 12/13/2017  . (No Known Allergies)    Family History  Problem Relation Age of Onset  . Colon cancer Sister 9    Social History   Socioeconomic History  . Marital status: Married    Spouse name: Russell Odom   . Number of children: 3  . Years of education: 42  . Highest education level: Not on file  Occupational History  . Occupation: Librarian, academic at [employer]  . Occupation: SUPERVISOR     Employer: [employer]  . Financial resource strain: Not on file  . Food insecurity:    Worry: Not on file    Inability: Not on file  . Transportation needs:    Medical: Not on file    Non-medical: Not on file  Tobacco Use  . Smoking status: Never Smoker  . Smokeless tobacco: Never Used  Substance  and Sexual Activity  . Alcohol use: No  . Drug use: No  . Sexual activity: Not on file  Lifestyle  . Physical activity:    Days per week: Not on file    Minutes per session: Not on file  . Stress: Not on file  Relationships  . Social connections:    Talks on phone: Not on file    Gets together: Not on file    Attends religious service: Not on file    Active member of club or organization: Not on file    Attends meetings of clubs or organizations: Not on file    Relationship status: Not on file  . Intimate  partner violence:    Fear of current or ex partner: Not on file    Emotionally abused: Not on file    Physically abused: Not on file    Forced sexual activity: Not on file  Other Topics Concern  . Not on file  Social History Narrative   Patient lives at home with wife Russell Odom.    Patient has 3 daughters.    Patient has a high school education.    Patient is right haneded.    Patient is working at [employer].     Review of Systems: See HPI, otherwise negative ROS  Physical Exam: BP (!) 145/83   Pulse 66   Temp 97.8 F (36.6 C) (Oral)   Resp 19   Ht 6\' 1"  (1.854 m)   Wt 245 lb (111.1 kg)   SpO2 100%   BMI 32.32 kg/m  General:   Alert,  Well-developed, well-nourished, pleasant and cooperative in NAD Neck:  Supple; no masses or thyromegaly. No significant cervical adenopathy. Lungs:  Clear throughout to auscultation.   No wheezes, crackles, or rhonchi. No acute distress. Heart:  Regular rate and rhythm; no murmurs, clicks, rubs,  or gallops. Abdomen: Non-distended, normal bowel sounds.  Soft and nontender without appreciable mass or hepatosplenomegaly.  Pulses:  Normal pulses noted. Extremities:  Without clubbing or edema.  Impression:   62 year old male here for surveillance colonoscopy.  Recommendations:  I have offered the patient a surveillance colonoscopy.  The risks, benefits, limitations, alternatives and imponderables have been reviewed with the patient. Questions have been answered. All parties are agreeable.      Notice: This dictation was prepared with Dragon dictation along with smaller phrase technology. Any transcriptional errors that result from this process are unintentional and may not be corrected upon review.

## 2018-02-01 NOTE — Discharge Instructions (Signed)
°Colonoscopy °Discharge Instructions ° °Read the instructions outlined below and refer to this sheet in the next few weeks. These discharge instructions provide you with general information on caring for yourself after you leave the hospital. Your doctor may also give you specific instructions. While your treatment has been planned according to the most current medical practices available, unavoidable complications occasionally occur. If you have any problems or questions after discharge, call Dr. Rourk at [phone]. °ACTIVITY °· You may resume your regular activity, but move at a slower pace for the next 24 hours.  °· Take frequent rest periods for the next 24 hours.  °· Walking will help get rid of the air and reduce the bloated feeling in your belly (abdomen).  °· No driving for 24 hours (because of the medicine (anesthesia) used during the test).   °· Do not sign any important legal documents or operate any machinery for 24 hours (because of the anesthesia used during the test).  °NUTRITION °· Drink plenty of fluids.  °· You may resume your normal diet as instructed by your doctor.  °· Begin with a light meal and progress to your normal diet. Heavy or fried foods are harder to digest and may make you feel sick to your stomach (nauseated).  °· Avoid alcoholic beverages for 24 hours or as instructed.  °MEDICATIONS °· You may resume your normal medications unless your doctor tells you otherwise.  °WHAT YOU CAN EXPECT TODAY °· Some feelings of bloating in the abdomen.  °· Passage of more gas than usual.  °· Spotting of blood in your stool or on the toilet paper.  °IF YOU HAD POLYPS REMOVED DURING THE COLONOSCOPY: °· No aspirin products for 7 days or as instructed.  °· No alcohol for 7 days or as instructed.  °· Eat a soft diet for the next 24 hours.  °FINDING OUT THE RESULTS OF YOUR TEST °Not all test results are available during your visit. If your test results are not back during the visit, make an appointment  with your caregiver to find out the results. Do not assume everything is normal if you have not heard from your caregiver or the medical facility. It is important for you to follow up on all of your test results.  °SEEK IMMEDIATE MEDICAL ATTENTION IF: °· You have more than a spotting of blood in your stool.  °· Your belly is swollen (abdominal distention).  °· You are nauseated or vomiting.  °· You have a temperature over 101.  °· You have abdominal pain or discomfort that is severe or gets worse throughout the day.  ° ° °Diverticulosis information provided ° °Further recommendations to follow pending review of pathology report ° ° ° ° °Diverticulosis °Diverticulosis is a condition that develops when small pouches (diverticula) form in the wall of the large intestine (colon). The colon is where water is absorbed and stool is formed. The pouches form when the inside layer of the colon pushes through weak spots in the outer layers of the colon. You may have a few pouches or many of them. °What are the causes? °The cause of this condition is not known. °What increases the risk? °The following factors may make you more likely to develop this condition: °· Being older than age 60. Your risk for this condition increases with age. Diverticulosis is rare among people younger than age 30. By age 80, many people have it. °· Eating a low-fiber diet. °· Having frequent constipation. °· Being overweight. °·   Not getting enough exercise. °· Smoking. °· Taking over-the-counter pain medicines, like aspirin and ibuprofen. °· Having a family history of diverticulosis. ° °What are the signs or symptoms? °In most people, there are no symptoms of this condition. If you do have symptoms, they may include: °· Bloating. °· Cramps in the abdomen. °· Constipation or diarrhea. °· Pain in the lower left side of the abdomen. ° °How is this diagnosed? °This condition is most often diagnosed during an exam for other colon problems. Because  diverticulosis usually has no symptoms, it often cannot be diagnosed independently. This condition may be diagnosed by: °· Using a flexible scope to examine the colon (colonoscopy). °· Taking an X-ray of the colon after dye has been put into the colon (barium enema). °· Doing a CT scan. ° °How is this treated? °You may not need treatment for this condition if you have never developed an infection related to diverticulosis. If you have had an infection before, treatment may include: °· Eating a high-fiber diet. This may include eating more fruits, vegetables, and grains. °· Taking a fiber supplement. °· Taking a live bacteria supplement (probiotic). °· Taking medicine to relax your colon. °· Taking antibiotic medicines. ° °Follow these instructions at home: °· Drink 6-8 glasses of water or more each day to prevent constipation. °· Try not to strain when you have a bowel movement. °· If you have had an infection before: °? Eat more fiber as directed by your health care provider or your diet and nutrition specialist (dietitian). °? Take a fiber supplement or probiotic, if your health care provider approves. °· Take over-the-counter and prescription medicines only as told by your health care provider. °· If you were prescribed an antibiotic, take it as told by your health care provider. Do not stop taking the antibiotic even if you start to feel better. °· Keep all follow-up visits as told by your health care provider. This is important. °Contact a health care provider if: °· You have pain in your abdomen. °· You have bloating. °· You have cramps. °· You have not had a bowel movement in 3 days. °Get help right away if: °· Your pain gets worse. °· Your bloating becomes very bad. °· You have a fever or chills, and your symptoms suddenly get worse. °· You vomit. °· You have bowel movements that are bloody or black. °· You have bleeding from your rectum. °Summary °· Diverticulosis is a condition that develops when small  pouches (diverticula) form in the wall of the large intestine (colon). °· You may have a few pouches or many of them. °· This condition is most often diagnosed during an exam for other colon problems. °· If you have had an infection related to diverticulosis, treatment may include increasing the fiber in your diet, taking supplements, or taking medicines. °This information is not intended to replace advice given to you by your health care provider. Make sure you discuss any questions you have with your health care provider. °Document Released: 06/24/2004 Document Revised: 08/16/2016 Document Reviewed: 08/16/2016 °Elsevier Interactive Patient Education © 2017 [employer] ° °

## 2018-02-01 NOTE — Op Note (Signed)
[facility] Patient Name: Russell Odom Procedure Date: 02/01/2018 9:04 AM MRN: [id number] Date of Birth: [birth year] Attending MD: Norvel Richards , MD CSN: [id number] Age: 62 Admit Type: Outpatient Procedure:                Colonoscopy Indications:              High risk colon cancer surveillance: Personal                            history of colonic polyps Providers:                Norvel Richards, MD, Renda Rolls, RN, Aram Candela Referring MD:              Medicines:                Meperidine 100 mg IV, Midazolam 4 mg IV,                            Ondansetron 4 mg IV Complications:            No immediate complications. Estimated Blood Loss:     Estimated blood loss was minimal. Procedure:                Pre-Anesthesia Assessment:                           - Prior to the procedure, a History and Physical                            was performed, and patient medications and                            allergies were reviewed. The patient's tolerance of                            previous anesthesia was also reviewed. The risks                            and benefits of the procedure and the sedation                            options and risks were discussed with the patient.                            All questions were answered, and informed consent                            was obtained. Prior Anticoagulants: The patient has                            taken no previous anticoagulant or antiplatelet  agents. ASA Grade Assessment: II - A patient with                            mild systemic disease. After reviewing the risks                            and benefits, the patient was deemed in                            satisfactory condition to undergo the procedure.                           After obtaining informed consent, the colonoscope                            was passed under direct vision. Throughout  the                            procedure, the patient's blood pressure, pulse, and                            oxygen saturations were monitored continuously. The                            EC-[id number] ([id number]) scope was introduced through                            the anus and advanced to the 5 cm into the ileum.                            The colonoscopy was performed without difficulty.                            The patient tolerated the procedure well. The                            quality of the bowel preparation was adequate. The                            terminal ileum, ileocecal valve, appendiceal                            orifice, and rectum were photographed. The entire                            colon was well visualized. Scope In: 9:15:53 AM Scope Out: 9:29:17 AM Scope Withdrawal Time: 0 hours 11 minutes 19 seconds  Total Procedure Duration: 0 hours 13 minutes 24 seconds  Findings:      The perianal and digital rectal examinations were normal.      A few small-mouthed diverticula were found in the entire colon. One of       the folds at the ileocecal valve appear to be somewhat larger than the       others and  somewhat injected erythematous appearing. Smooth; no       demarcation zone. Distal 5 cm a TI. Normal.      This was biopsied with a cold forceps for histology. Estimated blood       loss was minimal.      The exam was otherwise without abnormality on direct and retroflexion       views. Impression:               - Diverticulosis in the entire examined colon.                           - Erythematous mucosa at the ileocecal valve.                            Biopsied.                           - The examination was otherwise normal on direct                            and retroflexion views. Moderate Sedation:      Moderate (conscious) sedation was administered by the endoscopy nurse       and supervised by the endoscopist. The following parameters were        monitored: oxygen saturation, heart rate, blood pressure, respiratory       rate, EKG, adequacy of pulmonary ventilation, and response to care.       Total physician intraservice time was 19 minutes. Recommendation:           - Patient has a contact number available for                            emergencies. The signs and symptoms of potential                            delayed complications were discussed with the                            patient. Return to normal activities tomorrow.                            Written discharge instructions were provided to the                            patient.                           - Resume previous diet.                           - Continue present medications.                           - Repeat colonoscopy date to be determined after                            pending pathology results are reviewed for  surveillance.                           - Return to GI office (date not yet determined). Procedure Code(s):        --- Professional ---                           [id number], Colonoscopy, flexible; with biopsy, single                            or multiple                           G0500, Moderate sedation services provided by the                            same physician or other qualified health care                            professional performing a gastrointestinal                            endoscopic service that sedation supports,                            requiring the presence of an independent trained                            observer to assist in the monitoring of the                            patient's level of consciousness and physiological                            status; initial 15 minutes of intra-service time;                            patient age 51 years or older (additional time may                            be reported with [phone], as appropriate) Diagnosis Code(s):        --- Professional  ---                           Z86.010, Personal history of colonic polyps                           K63.89, Other specified diseases of intestine                           K57.30, Diverticulosis of large intestine without                            perforation or abscess without bleeding CPT copyright 2017 American Medical Association. All rights reserved. The codes documented in this report  are preliminary and upon coder review may  be revised to meet current compliance requirements. Cristopher Estimable. Rourk, MD Norvel Richards, MD 02/01/2018 9:37:23 AM This report has been signed electronically. Number of Addenda: 0

## 2018-02-05 ENCOUNTER — Encounter: Payer: Self-pay | Admitting: Internal Medicine

## 2018-02-06 ENCOUNTER — Encounter (HOSPITAL_COMMUNITY): Payer: Self-pay | Admitting: Internal Medicine

## 2018-10-25 ENCOUNTER — Ambulatory Visit (HOSPITAL_COMMUNITY)
Admission: RE | Admit: 2018-10-25 | Discharge: 2018-10-25 | Disposition: A | Discharge status: Home / Self Care | Payer: 59 | Source: Ambulatory Visit | Attending: *Deleted | Admitting: *Deleted

## 2018-10-25 ENCOUNTER — Other Ambulatory Visit: Payer: Self-pay | Admitting: *Deleted

## 2018-10-25 ENCOUNTER — Other Ambulatory Visit (HOSPITAL_COMMUNITY): Payer: Self-pay | Admitting: *Deleted

## 2018-10-25 DIAGNOSIS — R112 Nausea with vomiting, unspecified: Secondary | ICD-10-CM | POA: Diagnosis present

## 2018-10-25 DIAGNOSIS — R319 Hematuria, unspecified: Secondary | ICD-10-CM | POA: Diagnosis not present

## 2018-10-25 DIAGNOSIS — R1011 Right upper quadrant pain: Secondary | ICD-10-CM | POA: Insufficient documentation

## 2018-10-25 MED ORDER — IOHEXOL 300 MG/ML  SOLN
100.0000 mL | Freq: Once | INTRAMUSCULAR | Status: AC | PRN
  Administered 2018-10-25: 100 mL via INTRAVENOUS

## 2018-11-09 ENCOUNTER — Encounter (HOSPITAL_COMMUNITY): Payer: Self-pay | Admitting: General Practice

## 2018-11-09 ENCOUNTER — Other Ambulatory Visit: Payer: Self-pay | Admitting: Urology

## 2018-11-13 ENCOUNTER — Encounter (HOSPITAL_COMMUNITY)
Admission: RE | Disposition: A | Discharge status: Home / Self Care | Payer: Self-pay | Source: Home / Self Care | Attending: Urology

## 2018-11-13 ENCOUNTER — Ambulatory Visit (HOSPITAL_COMMUNITY)
Admission: RE | Admit: 2018-11-13 | Discharge: 2018-11-13 | Disposition: A | Discharge status: Home / Self Care | Payer: 59 | Attending: Urology | Admitting: Urology

## 2018-11-13 ENCOUNTER — Ambulatory Visit (HOSPITAL_COMMUNITY): Payer: 59

## 2018-11-13 ENCOUNTER — Encounter (HOSPITAL_COMMUNITY): Payer: Self-pay | Admitting: General Practice

## 2018-11-13 DIAGNOSIS — Z79899 Other long term (current) drug therapy: Secondary | ICD-10-CM | POA: Diagnosis not present

## 2018-11-13 DIAGNOSIS — Z7982 Long term (current) use of aspirin: Secondary | ICD-10-CM | POA: Diagnosis not present

## 2018-11-13 DIAGNOSIS — I10 Essential (primary) hypertension: Secondary | ICD-10-CM | POA: Diagnosis not present

## 2018-11-13 DIAGNOSIS — Z8673 Personal history of transient ischemic attack (TIA), and cerebral infarction without residual deficits: Secondary | ICD-10-CM | POA: Diagnosis not present

## 2018-11-13 DIAGNOSIS — N132 Hydronephrosis with renal and ureteral calculous obstruction: Secondary | ICD-10-CM | POA: Insufficient documentation

## 2018-11-13 DIAGNOSIS — N201 Calculus of ureter: Secondary | ICD-10-CM

## 2018-11-13 DIAGNOSIS — M109 Gout, unspecified: Secondary | ICD-10-CM | POA: Diagnosis not present

## 2018-11-13 DIAGNOSIS — E039 Hypothyroidism, unspecified: Secondary | ICD-10-CM | POA: Insufficient documentation

## 2018-11-13 DIAGNOSIS — E7800 Pure hypercholesterolemia, unspecified: Secondary | ICD-10-CM | POA: Insufficient documentation

## 2018-11-13 DIAGNOSIS — Z79890 Hormone replacement therapy (postmenopausal): Secondary | ICD-10-CM | POA: Diagnosis not present

## 2018-11-13 HISTORY — DX: Personal history of urinary calculi: Z87.442

## 2018-11-13 HISTORY — PX: EXTRACORPOREAL SHOCK WAVE LITHOTRIPSY: SHX1557

## 2018-11-13 SURGERY — LITHOTRIPSY, ESWL
Anesthesia: LOCAL | Laterality: Right

## 2018-11-13 MED ORDER — DIPHENHYDRAMINE HCL 25 MG PO CAPS
25.0000 mg | ORAL_CAPSULE | ORAL | Status: AC
  Administered 2018-11-13: 25 mg via ORAL
  Filled 2018-11-13: qty 1

## 2018-11-13 MED ORDER — SODIUM CHLORIDE 0.9 % IV SOLN
INTRAVENOUS | Status: DC
  Administered 2018-11-13: 07:00:00 via INTRAVENOUS

## 2018-11-13 MED ORDER — DIAZEPAM 5 MG PO TABS
10.0000 mg | ORAL_TABLET | ORAL | Status: AC
  Administered 2018-11-13: 10 mg via ORAL
  Filled 2018-11-13: qty 2

## 2018-11-13 MED ORDER — CIPROFLOXACIN HCL 500 MG PO TABS
500.0000 mg | ORAL_TABLET | ORAL | Status: AC
  Administered 2018-11-13: 500 mg via ORAL
  Filled 2018-11-13: qty 1

## 2018-11-13 MED ORDER — HYDROCODONE-ACETAMINOPHEN 5-325 MG PO TABS: 1.0000 | ORAL_TABLET | ORAL | 0 refills | Status: DC | PRN

## 2018-11-13 NOTE — Op Note (Signed)
See [facility] OP note scanned into chart. Also because of the size, density, location and other factors that cannot be anticipated I feel this will likely be a staged procedure. This fact supersedes any indication in the scanned [facility] operative note to the contrary.  

## 2018-11-13 NOTE — H&P (Signed)
See scanned H&P

## 2018-11-13 NOTE — Discharge Instructions (Signed)

## 2018-11-14 ENCOUNTER — Encounter (HOSPITAL_COMMUNITY): Payer: Self-pay | Admitting: Urology

## 2019-01-02 ENCOUNTER — Other Ambulatory Visit: Payer: Self-pay

## 2019-01-02 ENCOUNTER — Ambulatory Visit: Payer: 59 | Admitting: Neurology

## 2019-01-23 ENCOUNTER — Other Ambulatory Visit: Payer: Self-pay | Admitting: Urology

## 2019-01-24 ENCOUNTER — Other Ambulatory Visit: Payer: Self-pay

## 2019-01-24 ENCOUNTER — Encounter (HOSPITAL_COMMUNITY): Payer: Self-pay | Admitting: *Deleted

## 2019-01-24 NOTE — Progress Notes (Signed)
SPOKE W/  Patient via phone     SCREENING SYMPTOMS OF COVID 19:   COUGH-- no  RUNNY NOSE---  no  SORE THROAT--- no NASAL CONGESTION---- no  SNEEZING----no  SHORTNESS OF BREATH---no  DIFFICULTY BREATHING---no  TEMP >100.4-----no UNEXPLAINED BODY ACHES------no   HAVE YOU OR ANY FAMILY MEMBER TRAVELLED PAST 14 DAYS OUT OF THE   COUNTY---yes. Between  [place] and [place] only STATE----no COUNTRY----no  HAVE YOU OR ANY FAMILY MEMBER BEEN EXPOSED TO ANYONE WITH COVID 19? No   Spoke to patient via phone,history obtained,updated.  Bring blue folder,insurance cards,picture ID,designated driver and living will,POA, if desires (to be placed on chart). Reinforced no aspirin(instructions to hold aspirin per your doctor), ibuprofen products 72 hours prior to procedure. No vitamins or herbal medicines 7 days prior to procedure.   Follow laxative instructions provided by urologist (office) and in blue folder. Wear easy on/off clothing and no jewelry except wedding rings and ear rings. Leave all other valuables at home. Verbalizes understanding of instructions  No alcohol 24 hours prior to procedure.call md office if you have a cold,sorethroat or fever. Shower or bathe before your treatment. NPO after midnight except for lotrel and synthroid with sip of water

## 2019-01-29 ENCOUNTER — Ambulatory Visit (HOSPITAL_COMMUNITY)
Admission: RE | Admit: 2019-01-29 | Discharge: 2019-01-29 | Disposition: A | Discharge status: Home / Self Care | Payer: 59 | Attending: Urology | Admitting: Urology

## 2019-01-29 ENCOUNTER — Encounter (HOSPITAL_COMMUNITY): Payer: Self-pay | Admitting: *Deleted

## 2019-01-29 ENCOUNTER — Ambulatory Visit (HOSPITAL_COMMUNITY): Payer: 59

## 2019-01-29 ENCOUNTER — Encounter (HOSPITAL_COMMUNITY)
Admission: RE | Disposition: A | Discharge status: Home / Self Care | Payer: Self-pay | Source: Home / Self Care | Attending: Urology

## 2019-01-29 DIAGNOSIS — N200 Calculus of kidney: Secondary | ICD-10-CM | POA: Insufficient documentation

## 2019-01-29 DIAGNOSIS — Z8673 Personal history of transient ischemic attack (TIA), and cerebral infarction without residual deficits: Secondary | ICD-10-CM | POA: Insufficient documentation

## 2019-01-29 DIAGNOSIS — Z7902 Long term (current) use of antithrombotics/antiplatelets: Secondary | ICD-10-CM | POA: Diagnosis not present

## 2019-01-29 DIAGNOSIS — I10 Essential (primary) hypertension: Secondary | ICD-10-CM | POA: Insufficient documentation

## 2019-01-29 DIAGNOSIS — N201 Calculus of ureter: Secondary | ICD-10-CM

## 2019-01-29 HISTORY — PX: EXTRACORPOREAL SHOCK WAVE LITHOTRIPSY: SHX1557

## 2019-01-29 SURGERY — LITHOTRIPSY, ESWL
Anesthesia: LOCAL | Laterality: Right

## 2019-01-29 MED ORDER — CIPROFLOXACIN HCL 500 MG PO TABS
500.0000 mg | ORAL_TABLET | ORAL | Status: AC
  Administered 2019-01-29: 500 mg via ORAL
  Filled 2019-01-29: qty 1

## 2019-01-29 MED ORDER — SODIUM CHLORIDE 0.9 % IV SOLN
INTRAVENOUS | Status: DC
  Administered 2019-01-29: 07:00:00 via INTRAVENOUS

## 2019-01-29 MED ORDER — DIAZEPAM 5 MG PO TABS
10.0000 mg | ORAL_TABLET | ORAL | Status: AC
  Administered 2019-01-29: 10 mg via ORAL
  Filled 2019-01-29: qty 2

## 2019-01-29 MED ORDER — DIPHENHYDRAMINE HCL 25 MG PO CAPS
25.0000 mg | ORAL_CAPSULE | ORAL | Status: AC
  Administered 2019-01-29: 25 mg via ORAL
  Filled 2019-01-29: qty 1

## 2019-01-29 NOTE — Discharge Instructions (Signed)
Lithotripsy, Care After °This sheet gives you information about how to care for yourself after your procedure. Your health care provider may also give you more specific instructions. If you have problems or questions, contact your health care provider. °What can I expect after the procedure? °After the procedure, it is common to have: °· Some blood in your urine. This should only last for a few days. °· Soreness in your back, sides, or upper abdomen for a few days. °· Blotches or bruises on your back where the pressure wave entered the skin. °· Pain, discomfort, or nausea when pieces (fragments) of the kidney stone move through the tube that carries urine from the kidney to the bladder (ureter). Stone fragments may pass soon after the procedure, but they may continue to pass for up to 4-8 weeks. °? If you have severe pain or nausea, contact your health care provider. This may be caused by a large stone that was not broken up, and this may mean that you need more treatment. °· Some pain or discomfort during urination. °· Some pain or discomfort in the lower abdomen or (in men) at the base of the penis. °Follow these instructions at home: °Medicines °· Take over-the-counter and prescription medicines only as told by your health care provider. °· If you were prescribed an antibiotic medicine, take it as told by your health care provider. Do not stop taking the antibiotic even if you start to feel better. °· Do not drive for 24 hours if you were given a medicine to help you relax (sedative). °· Do not drive or use heavy machinery while taking prescription pain medicine. °Eating and drinking ° °  ° °· Drink enough water and fluids to keep your urine clear or pale yellow. This helps any remaining pieces of the stone to pass. It can also help prevent new stones from forming. °· Eat plenty of fresh fruits and vegetables. °· Follow instructions from your health care provider about eating and drinking restrictions. You may be  instructed: °? To reduce how much salt (sodium) you eat or drink. Check ingredients and nutrition facts on packaged foods and beverages. °? To reduce how much meat you eat. °· Eat the recommended amount of calcium for your age and gender. Ask your health care provider how much calcium you should have. °General instructions °· Get plenty of rest. °· Most people can resume normal activities 1-2 days after the procedure. Ask your health care provider what activities are safe for you. °· Your health care provider may direct you to lie in a certain position (postural drainage) and tap firmly (percuss) over your kidney area to help stone fragments pass. Follow instructions as told by your health care provider. °· If directed, strain all urine through the strainer that was provided by your health care provider. °? Keep all fragments for your health care provider to see. Any stones that are found may be sent to a medical lab for examination. The stone may be as small as a grain of salt. °· Keep all follow-up visits as told by your health care provider. This is important. °Contact a health care provider if: °· You have pain that is severe or does not get better with medicine. °· You have nausea that is severe or does not go away. °· You have blood in your urine longer than your health care provider told you to expect. °· You have more blood in your urine. °· You have pain during urination that does   not go away. °· You urinate more frequently than usual and this does not go away. °· You develop a rash or any other possible signs of an allergic reaction. °Get help right away if: °· You have severe pain in your back, sides, or upper abdomen. °· You have severe pain while urinating. °· Your urine is very dark red. °· You have blood in your stool (feces). °· You cannot pass any urine at all. °· You feel a strong urge to urinate after emptying your bladder. °· You have a fever or chills. °· You develop shortness of breath,  difficulty breathing, or chest pain. °· You have severe nausea that leads to persistent vomiting. °· You faint. °Summary °· After this procedure, it is common to have some pain, discomfort, or nausea when pieces (fragments) of the kidney stone move through the tube that carries urine from the kidney to the bladder (ureter). If this pain or nausea is severe, however, you should contact your health care provider. °· Most people can resume normal activities 1-2 days after the procedure. Ask your health care provider what activities are safe for you. °· Drink enough water and fluids to keep your urine clear or pale yellow. This helps any remaining pieces of the stone to pass, and it can help prevent new stones from forming. °· If directed, strain your urine and keep all fragments for your health care provider to see. Fragments or stones may be as small as a grain of salt. °· Get help right away if you have severe pain in your back, sides, or upper abdomen or have severe pain while urinating. °This information is not intended to replace advice given to you by your health care provider. Make sure you discuss any questions you have with your health care provider. °Document Released: 10/17/2007 Document Revised: 03/08/2018 Document Reviewed: 08/18/2016 °Elsevier Interactive Patient Education © 2019 [employer] ° ° °Moderate Conscious Sedation, Adult, Care After °These instructions provide you with information about caring for yourself after your procedure. Your health care provider may also give you more specific instructions. Your treatment has been planned according to current medical practices, but problems sometimes occur. Call your health care provider if you have any problems or questions after your procedure. °What can I expect after the procedure? °After your procedure, it is common: °· To feel sleepy for several hours. °· To feel clumsy and have poor balance for several hours. °· To have poor judgment for several  hours. °· To vomit if you eat too soon. °Follow these instructions at home: °For at least 24 hours after the procedure: ° °· Do not: °? Participate in activities where you could fall or become injured. °? Drive. °? Use heavy machinery. °? Drink alcohol. °? Take sleeping pills or medicines that cause drowsiness. °? Make important decisions or sign legal documents. °? Take care of children on your own. °· Rest. °Eating and drinking °· Follow the diet recommended by your health care provider. °· If you vomit: °? Drink water, juice, or soup when you can drink without vomiting. °? Make sure you have little or no nausea before eating solid foods. °General instructions °· Have a responsible adult stay with you until you are awake and alert. °· Take over-the-counter and prescription medicines only as told by your health care provider. °· If you smoke, do not smoke without supervision. °· Keep all follow-up visits as told by your health care provider. This is important. °Contact a health care provider   if: °· You keep feeling nauseous or you keep vomiting. °· You feel light-headed. °· You develop a rash. °· You have a fever. °Get help right away if: °· You have trouble breathing. °This information is not intended to replace advice given to you by your health care provider. Make sure you discuss any questions you have with your health care provider. °Document Released: 07/18/2013 Document Revised: 03/01/2016 Document Reviewed: 01/17/2016 °Elsevier Interactive Patient Education © 2019 Elsevier Inc. ° ° °

## 2019-01-29 NOTE — Op Note (Signed)
See [facility] OP note scanned into chart. Also because of the size, density, location and other factors that cannot be anticipated I feel this will likely be a staged procedure. This fact supersedes any indication in the scanned [facility] operative note to the contrary.  

## 2019-01-29 NOTE — H&P (Signed)
See scanned h&p 

## 2019-01-30 ENCOUNTER — Encounter (HOSPITAL_COMMUNITY): Payer: Self-pay | Admitting: Urology

## 2019-03-08 ENCOUNTER — Telehealth: Payer: Self-pay

## 2019-03-08 NOTE — Telephone Encounter (Signed)
I called pt that visit will be video due to COVID 19. Pt gave verbal consent to file insurance and to do video. Pt has a apple phone and knows how to receive text messages. His cell carrier is [employer]. Pt understands the doxy process and appt time.

## 2019-03-13 NOTE — Telephone Encounter (Signed)
Link text to pts cell phone at 503pm.

## 2019-03-14 ENCOUNTER — Ambulatory Visit (INDEPENDENT_AMBULATORY_CARE_PROVIDER_SITE_OTHER): Payer: 59 | Admitting: Neurology

## 2019-03-14 ENCOUNTER — Encounter: Payer: Self-pay | Admitting: Neurology

## 2019-03-14 ENCOUNTER — Other Ambulatory Visit: Payer: Self-pay

## 2019-03-14 DIAGNOSIS — R42 Dizziness and giddiness: Secondary | ICD-10-CM

## 2019-03-14 DIAGNOSIS — H81399 Other peripheral vertigo, unspecified ear: Secondary | ICD-10-CM | POA: Diagnosis not present

## 2019-03-14 DIAGNOSIS — R251 Tremor, unspecified: Secondary | ICD-10-CM | POA: Diagnosis not present

## 2019-03-14 NOTE — Progress Notes (Signed)
Virtual Visit via Video Note  I connected with Russell Odom on 03/14/19 at  1:00 PM EDT by a video enabled telemedicine application and verified that I am speaking with the correct person using two identifiers.  Location: Patient: at his work place Provider: at [employer] office  Referring physician: Yisroel Ramming, NP Reason for referral: dizzy spells and tremors  I discussed the limitations of evaluation and management by telemedicine and the availability of in person appointments. The patient expressed understanding and agreed to proceed.  This visit was performed using [email or web address] app for audio and video.  History of Present Illness: Russell Odom is a 63 year old Caucasian male who has been having intermittent dizzy episodes for the last 4 to 5 months and intermittent right hand tremors for the last 6 months or so.  He describes the dizzy episodes as happening suddenly without warning without obvious triggers.  He describes this as a feeling of lightheadedness and feeling slightly off balance.  He denies accompanying nausea, vomiting, headache, double vision.  He denies accompanying extremity weakness numbness.  These are not particularly related to changes in head position.  He denies any chest pain, palpitations, cold and clammy feeling in his extremities.  He does have a remote history of a right parietal infarct in 2007 from which he made good recovery he did have some paresthesias in the left hand which appear to have improved.  He was lost to follow-up in my clinic since 2016.  He has been on Aggrenox since then and is tolerating it well without side effects without known recurrent strokes or TIAs.  Is also on medication for blood pressure and cholesterol and feels both of them under good control.  He has not had any recent lab work or brain imaging studies done. He also complains of intermittent right hand fine action tremor which is noted for 6 months.  The tremors mild and did not significant  interval interfere with his day-to-day activities.  Occasionally tremors are noticeable and he has to hold a cup with both his hands.  He is reluctant to try medications at the present time.  He states his mother had similar tremors in her early 58s.  He does not drink alcohol.  He denies excessive drooling of saliva, bradykinesia, changes in his gait like short steps or tooping down.   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  Hypertension.  Hyperlipidemia.  Hypothyroidism.  Arthritis. Past surgical history none Social history patient is married.  Does not smoke or drink.  Is currently employed Medication allergies none 14 system review of systems as documented above in history of present illness otherwise negative Observations/Objective: Physical and neurological exams were limited due to constraints of virtual video visit.  Pleasant middle-age Caucasian male currently not in distress.  He is awake alert oriented to time place and person.  Speech and language appear normal.  Extraocular movements are full range without nystagmus.  Face is symmetric without weakness.  Tongue is midline.  Motor system exam shows symmetric upper and lower extremity strength without focal weakness.  Fine finger movements are symmetric.  He has very fine action tremor of right upper extremity and arms are held outstretched in front.  He is able to draw spiral and write a sentence without significant tremulousness.  There is no bradykinesia.  He is able to get up from the chair with arms folded across the chest easily.  His gait appears steady without  diminished arm swing. Assessment  : 63 year old Caucasian male with recurrent transient episodes of lightheadedness and dizziness of unclear etiology.  Possibility of vertebrobasilar insufficiency given prior history of known vertebral artery occlusion and stroke is a consideration.  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  He lacks associated features of Parkinson's. PLAN : I had a long discussion with the patient regarding his dizzy episodes as well as hand tremors and discussed differential diagnosis and plan for evaluation and treatment and answered questions.  I recommend further evaluation by checking MRI scan of the brain, MRA of the brain and neck, lipid profile, hemoglobin A1c and checking lab work for causes of tremor.  Recommend he continue Aggrenox for stroke prevention and maintain strict control of hypertension with blood pressure goal below 130/90, lipids with LDL cholesterol goal below 70 mg percent and diabetes with hemoglobin A1c goal below 6.5%.  I have advised him to maintain adequate hydration.  His tremors at the present time do not appear to be functionally disabling hence we will hold off on trial of medications like Topamax or  Mysoline for now.  He was advised to call me if his tremor gets worse. Follow Up Instructions: Return for a inperson follow-up visit in 4 weeks   I discussed the assessment and treatment plan with the patient. The patient was provided an opportunity to ask questions and all were answered. The patient agreed with the plan and demonstrated an understanding of the instructions.   The patient was advised to call back or seek an in-person evaluation if the symptoms worsen or if the condition fails to improve as anticipated.  I provided 22minutes of non-face-to-face time during this encounter.   Russell Contras, MD

## 2019-03-15 ENCOUNTER — Telehealth: Payer: Self-pay | Admitting: Neurology

## 2019-03-15 NOTE — Telephone Encounter (Signed)
[employer] order sent to GI. They will obtain the auth and reach out to the patient to schedule.  

## 2019-04-03 ENCOUNTER — Ambulatory Visit
Admission: RE | Admit: 2019-04-03 | Discharge: 2019-04-03 | Disposition: A | Discharge status: Home / Self Care | Payer: 59 | Source: Ambulatory Visit | Attending: Neurology | Admitting: Neurology

## 2019-04-03 ENCOUNTER — Other Ambulatory Visit: Payer: Self-pay

## 2019-04-03 DIAGNOSIS — H81399 Other peripheral vertigo, unspecified ear: Secondary | ICD-10-CM

## 2019-04-03 MED ORDER — GADOBENATE DIMEGLUMINE 529 MG/ML IV SOLN
20.0000 mL | Freq: Once | INTRAVENOUS | Status: AC | PRN
  Administered 2019-04-03: 20 mL via INTRAVENOUS

## 2019-05-08 ENCOUNTER — Ambulatory Visit (INDEPENDENT_AMBULATORY_CARE_PROVIDER_SITE_OTHER): Payer: 59 | Admitting: Neurology

## 2019-05-08 ENCOUNTER — Encounter: Payer: Self-pay | Admitting: Neurology

## 2019-05-08 ENCOUNTER — Other Ambulatory Visit: Payer: Self-pay

## 2019-05-08 VITALS — BP 116/71 | HR 67 | Temp 98.7°F | Wt 237.0 lb

## 2019-05-08 DIAGNOSIS — R413 Other amnesia: Secondary | ICD-10-CM

## 2019-05-08 NOTE — Progress Notes (Signed)
[facility] [address] [place]. [place] [address] [phone]       OFFICE FOLLOW-UP NOTE  Russell Odom Date of Birth:  26-Jul-1956 Medical Record Number:  [id number]   HPI: Initial virtual video visit 03/14/2019 : Russell Odom is a 63 year old Caucasian male who has been having intermittent dizzy episodes for the last 4 to 5 months and intermittent right hand tremors for the last 6 months or so.  He describes the dizzy episodes as happening suddenly without warning without obvious triggers.  He describes this as a feeling of lightheadedness and feeling slightly off balance.  He denies accompanying nausea, vomiting, headache, double vision.  He denies accompanying extremity weakness numbness.  These are not particularly related to changes in head position.  He denies any chest pain, palpitations, cold and clammy feeling in his extremities.  He does have a remote history of a right parietal infarct in 2007 from which he made good recovery he did have some paresthesias in the left hand which appear to have improved.  He was lost to follow-up in my clinic since 2016.  He has been on Aggrenox since then and is tolerating it well without side effects without known recurrent strokes or TIAs.  Is also on medication for blood pressure and cholesterol and feels both of them under good control.  He has not had any recent lab work or brain imaging studies done. He also complains of intermittent right hand fine action tremor which is noted for 6 months.  The tremors mild and did not significant interval interfere with his day-to-day activities.  Occasionally tremors are noticeable and he has to hold a cup with both his hands.  He is reluctant to try medications at the present time.  He states his mother had similar tremors in her early 46s.  He does not drink alcohol.  He denies excessive drooling of saliva, bradykinesia, changes in his gait like short steps or tooping down Update  05/08/2019 : Patient is seen today for office follow-up visit following virtual video visit 6 weeks ago.  He he states he continues to have intermittent dizziness.  He describes as a feeling of imbalance and not true vertigo.  This is noticed mostly when he is trying to bend down or stand up suddenly or walk quickly up or down the steps.  He can hold onto something and avoid falling.  He has had no falls or injuries.  He denies any tinnitus or decreased hearing or vertigo.  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  MRA of the neck shows no significant large vessel stenosis or occlusion right vertebral artery is hypoplastic throughout its course.  MRI of the brain also shows no large vessel stenosis with hypoplastic terminal right vertebral artery.  I advise lab work for reversible causes of cognitive impairment and EEG due to this studies have not yet been done.  Patient continues to complain of mild short-term memory difficulties.  He has not been participating in any cognitively challenging activities.  He is not had any evaluation for his cognitive impairment yet ROS:   14 system review of systems is positive for memory loss, hand tremors, dizziness, imbalance and all other systems negative  PMH:  Past Medical History:  Diagnosis Date  . Arthritis,  gouty   . 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 History of esophageal dilatation   . History of kidney stones   . HTN (hypertension)   . Hypothyroid   . Mixed hyperlipidemia     Social History:  Social History    Socioeconomic History  . Marital status: Married    Spouse name: [name]   . Number of children: 3  . Years of education: 67  . Highest education level: Not on file  Occupational History  . Occupation: Librarian, academic at [employer]  . Occupation: SUPERVISOR     Employer: [employer]  . Financial resource strain: Not on file  . Food insecurity    Worry: Not on file    Inability: Not on file  . Transportation needs    Medical: Not on file    Non-medical: Not on file  Tobacco Use  . Smoking status: Never Smoker  . Smokeless tobacco: Never Used  Substance and Sexual Activity  . Alcohol use: No  . Drug use: No  . Sexual activity: Not on file  Lifestyle  . Physical activity    Days per week: Not on file    Minutes per session: Not on file  . Stress: Not on file  Relationships  . Social Herbalist on phone: Not on file    Gets together: Not on file    Attends religious service: Not on file    Active member of club or organization: Not on file    Attends meetings of clubs or organizations: Not on file    Relationship status: Not on file  . Intimate partner violence    Fear of current or ex partner: Not on file    Emotionally abused: Not on file    Physically abused: Not on file    Forced sexual activity: Not on file  Other Topics Concern  . Not on file  Social History Narrative   Patient lives at home with wife [name].    Patient has 3 daughters.    Patient has a high school education.    Patient is right haneded.    Patient is working at [employer].     Medications:   Current Outpatient Medications on File Prior to Visit  Medication Sig Dispense Refill  . allopurinol (ZYLOPRIM) 300 MG tablet Take 300 mg by mouth daily.  6  . amLODipine-benazepril (LOTREL) 5-40 MG per capsule Take 1 capsule by mouth daily.    Marland Kitchen dipyridamole-aspirin (AGGRENOX) 200-25 MG per 12 hr capsule Take 1 capsule by mouth 2 (two) times daily.    Marland Kitchen  HYDROcodone-acetaminophen (NORCO/VICODIN) 5-325 MG tablet Take 1 tablet by mouth every 4 (four) hours as needed for moderate pain. 12 tablet 0  . levothyroxine (SYNTHROID, LEVOTHROID) 50 MCG tablet Take 50 mcg by mouth daily before breakfast.     . lovastatin (MEVACOR) 40 MG tablet Take 40 mg by mouth at bedtime.     Marland Kitchen MEGARED OMEGA-3 KRILL OIL 500 MG CAPS Take 500 mg by mouth at bedtime.    . Multiple Vitamins-Minerals (MULTIVITAMINS THER. W/MINERALS) TABS Take 1 tablet by mouth daily.    . niacin 500 MG tablet Take 500 mg by mouth at bedtime.    Marland Kitchen  tamsulosin (FLOMAX) 0.4 MG CAPS capsule Take 0.4 mg by mouth daily.      No current facility-administered medications on file prior to visit.     Allergies:  No Known Allergies  Physical Exam General: well developed, well nourished middle-aged Caucasian male, seated, in no evident distress Head: head normocephalic and atraumatic.  Neck: supple with no carotid or supraclavicular bruits Cardiovascular: regular rate and rhythm, no murmurs Musculoskeletal: no deformity Skin:  no rash/petichiae Vascular:  Normal pulses all extremities Vitals:   05/08/19 1516  BP: 116/71  Pulse: 67  Temp: 98.7 F (37.1 C)   Neurologic Exam Mental Status: Awake and fully alert. Oriented to place and time. Recent and remote memory intact. Attention span, concentration and fund of knowledge appropriate. Mood and affect appropriate.  Diminished recall 2/3.  Able to name 11 animals which can walk on 4 legs.  Clock drawing 4/4 Cranial Nerves: Fundoscopic exam reveals sharp disc margins. Pupils equal, briskly reactive to light. Extraocular movements full without nystagmus. Visual fields full to confrontation. Hearing intact. Facial sensation intact. Face, tongue, palate moves normally and symmetrically.  Motor: Normal bulk and tone. Normal strength in all tested extremity muscles.  Mild upper extremity action tremors.  No pill-rolling.  No cogwheel rigidity or  bradykinesia. Sensory.: intact to touch ,pinprick .position and vibratory sensation.  Coordination: Rapid alternating movements normal in all extremities. Finger-to-nose and heel-to-shin performed accurately bilaterally. Gait and Station: Arises from chair without difficulty. Stance is normal. Gait demonstrates normal stride length and balance . Able to heel, toe and tandem walk with moderate difficulty.  Reflexes: 1+ and symmetric. Toes downgoing.      ASSESSMENT: 63 year old Caucasian male with recurrent transient episodes of lightheadedness and dizziness related to change in position likely combination of mild vestibular dysfunction and orthostasis.  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  He also has mild memory difficulties due to mild cognitive impairment. 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  I advised him to get up slowly and avoid sudden quick jerking movements and to do orthostatic tolerance exercises prior to getting up. I recommend further evaluation by checking lipid profile, hemoglobin A1c and checking lab work for reversible causes of memory loss.  Recommend he continue Aggrenox for stroke prevention and maintain strict control of hypertension with blood pressure goal below 130/90, lipids with LDL cholesterol goal below 70 mg percent and diabetes with hemoglobin A1c goal below 6.5%.  I have advised him to maintain adequate hydration.  His tremors at the present time do not appear to be functionally disabling  hence we will hold off on trial of medications like Topamax or  Mysoline for now.  We also discussed memory compensation strategies and advised him to increase participation in cognitively challenging activities like solving crossword puzzle, playing bridge and sudoku.  He will return for follow-up in 6 months or call earlier if necessary. Greater than 50% of time during this 25 minute visit was spent on counseling,explanation of diagnosis, planning of further management, discussion with patient and family and  coordination of care Antony Contras, MD  [facility] [address] [place],  [id number]  Phone [phone] Fax [phone] Note: This document was prepared with digital dictation and possible smart phrase technology. Any transcriptional errors that result from this process are unintentional

## 2019-05-08 NOTE — Patient Instructions (Signed)
I had a long discussion with the patient and his wife regarding his dizzy episodes , mild memory loss and discussed differential diagnosis and  answered questions.  I advised him to get up slowly and avoid sudden quick jerking movements and to do orthostatic tolerance exercises prior to getting up. I recommend further evaluation by checking lipid profile, hemoglobin A1c and checking lab work for reversible causes of memory loss.  Recommend he continue Aggrenox for stroke prevention and maintain strict control of hypertension with blood pressure goal below 130/90, lipids with LDL cholesterol goal below 70 mg percent and diabetes with hemoglobin A1c goal below 6.5%.  I have advised him to maintain adequate hydration.  His tremors at the present time do not appear to be functionally disabling hence we will hold off on trial of medications like Topamax or  Mysoline for now.  We also discussed memory compensation strategies and advised him to increase participation in cognitively challenging activities like solving crossword puzzle, playing bridge and sudoku.  He will return for follow-up in 6 months or call earlier if necessary. Memory Compensation Strategies  1. Use "WARM" strategy.  W= write it down  A= associate it  R= repeat it  M= make a mental note  2.   You can keep a Social worker.  Use a 3-ring notebook with sections for the following: calendar, important names and phone numbers,  medications, doctors' names/phone numbers, lists/reminders, and a section to journal what you did  each day.   3.    Use a calendar to write appointments down.  4.    Write yourself a schedule for the day.  This can be placed on the calendar or in a separate section of the Memory Notebook.  Keeping a  regular schedule can help memory.  5.    Use medication organizer with sections for each day or morning/evening pills.  You may need help loading it  6.    Keep a basket, or pegboard by the door.  Place items that  you need to take out with you in the basket or on the pegboard.  You may also want to  include a message board for reminders.  7.    Use sticky notes.  Place sticky notes with reminders in a place where the task is performed.  For example: " turn off the  stove" placed by the stove, "lock the door" placed on the door at eye level, " take your medications" on  the bathroom mirror or by the place where you normally take your medications.  8.    Use alarms/timers.  Use while cooking to remind yourself to check on food or as a reminder to take your medicine, or as a  reminder to make a call, or as a reminder to perform another task, etc.

## 2019-05-09 LAB — SPECIMEN STATUS

## 2019-05-09 LAB — DEMENTIA PANEL
Homocysteine: 16.6 umol/L (ref 0.0–17.2)
RPR Ser Ql: NONREACTIVE
TSH: 4.63 u[IU]/mL — ABNORMAL HIGH (ref 0.450–4.500)
Vitamin B-12: 386 pg/mL (ref 232–1245)

## 2019-05-28 ENCOUNTER — Ambulatory Visit (INDEPENDENT_AMBULATORY_CARE_PROVIDER_SITE_OTHER): Payer: 59

## 2019-05-28 ENCOUNTER — Other Ambulatory Visit: Payer: Self-pay

## 2019-05-28 DIAGNOSIS — R410 Disorientation, unspecified: Secondary | ICD-10-CM | POA: Diagnosis not present

## 2019-05-28 DIAGNOSIS — R413 Other amnesia: Secondary | ICD-10-CM

## 2019-06-08 ENCOUNTER — Emergency Department (HOSPITAL_COMMUNITY)
Admission: EM | Admit: 2019-06-08 | Discharge: 2019-06-08 | Disposition: A | Discharge status: Home / Self Care | Payer: 59 | Attending: Emergency Medicine | Admitting: Emergency Medicine

## 2019-06-08 ENCOUNTER — Encounter (HOSPITAL_COMMUNITY): Payer: Self-pay | Admitting: *Deleted

## 2019-06-08 ENCOUNTER — Other Ambulatory Visit: Payer: Self-pay

## 2019-06-08 ENCOUNTER — Emergency Department (HOSPITAL_COMMUNITY): Payer: 59

## 2019-06-08 DIAGNOSIS — Z8673 Personal history of transient ischemic attack (TIA), and cerebral infarction without residual deficits: Secondary | ICD-10-CM | POA: Diagnosis not present

## 2019-06-08 DIAGNOSIS — J181 Lobar pneumonia, unspecified organism: Secondary | ICD-10-CM | POA: Diagnosis not present

## 2019-06-08 DIAGNOSIS — I10 Essential (primary) hypertension: Secondary | ICD-10-CM | POA: Diagnosis not present

## 2019-06-08 DIAGNOSIS — Z79899 Other long term (current) drug therapy: Secondary | ICD-10-CM | POA: Diagnosis not present

## 2019-06-08 DIAGNOSIS — A419 Sepsis, unspecified organism: Secondary | ICD-10-CM

## 2019-06-08 DIAGNOSIS — R509 Fever, unspecified: Secondary | ICD-10-CM | POA: Diagnosis present

## 2019-06-08 DIAGNOSIS — Z20828 Contact with and (suspected) exposure to other viral communicable diseases: Secondary | ICD-10-CM | POA: Diagnosis not present

## 2019-06-08 DIAGNOSIS — J189 Pneumonia, unspecified organism: Secondary | ICD-10-CM

## 2019-06-08 DIAGNOSIS — E039 Hypothyroidism, unspecified: Secondary | ICD-10-CM | POA: Diagnosis not present

## 2019-06-08 LAB — PROTIME-INR
INR: 1.1 (ref 0.8–1.2)
Prothrombin Time: 13.8 seconds (ref 11.4–15.2)

## 2019-06-08 LAB — URINALYSIS, ROUTINE W REFLEX MICROSCOPIC
Bilirubin Urine: NEGATIVE
Glucose, UA: NEGATIVE mg/dL
Hgb urine dipstick: NEGATIVE
Ketones, ur: NEGATIVE mg/dL
Leukocytes,Ua: NEGATIVE
Nitrite: NEGATIVE
Protein, ur: 100 mg/dL — AB
Specific Gravity, Urine: 1.027 (ref 1.005–1.030)
pH: 5 (ref 5.0–8.0)

## 2019-06-08 LAB — COMPREHENSIVE METABOLIC PANEL
ALT: 23 U/L (ref 0–44)
AST: 21 U/L (ref 15–41)
Albumin: 3.9 g/dL (ref 3.5–5.0)
Alkaline Phosphatase: 86 U/L (ref 38–126)
Anion gap: 10 (ref 5–15)
BUN: 25 mg/dL — ABNORMAL HIGH (ref 8–23)
CO2: 23 mmol/L (ref 22–32)
Calcium: 8.8 mg/dL — ABNORMAL LOW (ref 8.9–10.3)
Chloride: 103 mmol/L (ref 98–111)
Creatinine, Ser: 1.24 mg/dL (ref 0.61–1.24)
GFR calc Af Amer: >60 mL/min (ref >60)
GFR calc non Af Amer: >60 mL/min (ref >60)
Glucose, Bld: 131 mg/dL — ABNORMAL HIGH (ref 70–99)
Potassium: 4 mmol/L (ref 3.5–5.1)
Sodium: 136 mmol/L (ref 135–145)
Total Bilirubin: 0.9 mg/dL (ref 0.3–1.2)
Total Protein: 7.9 g/dL (ref 6.5–8.1)

## 2019-06-08 LAB — CBC WITH DIFFERENTIAL/PLATELET
Abs Immature Granulocytes: 0.05 10*3/uL (ref 0.00–0.07)
Basophils Absolute: 0 10*3/uL (ref 0.0–0.1)
Basophils Relative: 0 %
Eosinophils Absolute: 0 10*3/uL (ref 0.0–0.5)
Eosinophils Relative: 0 %
HCT: 40.7 % (ref 39.0–52.0)
Hemoglobin: 13.6 g/dL (ref 13.0–17.0)
Immature Granulocytes: 1 %
Lymphocytes Relative: 13 %
Lymphs Abs: 1.3 10*3/uL (ref 0.7–4.0)
MCH: 32.5 pg (ref 26.0–34.0)
MCHC: 33.4 g/dL (ref 30.0–36.0)
MCV: 97.4 fL (ref 80.0–100.0)
Monocytes Absolute: 0.9 10*3/uL (ref 0.1–1.0)
Monocytes Relative: 8 %
Neutro Abs: 8 10*3/uL — ABNORMAL HIGH (ref 1.7–7.7)
Neutrophils Relative %: 78 %
Platelets: 245 10*3/uL (ref 150–400)
RBC: 4.18 MIL/uL — ABNORMAL LOW (ref 4.22–5.81)
RDW: 14.6 % (ref 11.5–15.5)
WBC: 10.2 10*3/uL (ref 4.0–10.5)
nRBC: 0 % (ref 0.0–0.2)

## 2019-06-08 LAB — LACTIC ACID, PLASMA: Lactic Acid, Venous: 1.1 mmol/L (ref 0.5–1.9)

## 2019-06-08 LAB — SARS CORONAVIRUS 2 BY RT PCR (HOSPITAL ORDER, PERFORMED IN ~~LOC~~ HOSPITAL LAB): SARS Coronavirus 2: NEGATIVE

## 2019-06-08 LAB — APTT: aPTT: 33 seconds (ref 24–36)

## 2019-06-08 MED ORDER — AZITHROMYCIN 250 MG PO TABS: 250.0000 mg | ORAL_TABLET | Freq: Every day | ORAL | 0 refills | Status: DC

## 2019-06-08 MED ORDER — SODIUM CHLORIDE 0.9 % IV SOLN
1.0000 g | Freq: Once | INTRAVENOUS | Status: AC
  Administered 2019-06-08: 17:00:00 1 g via INTRAVENOUS
  Filled 2019-06-08: qty 10

## 2019-06-08 MED ORDER — AMOXICILLIN-POT CLAVULANATE 875-125 MG PO TABS: 1.0000 | ORAL_TABLET | Freq: Two times a day (BID) | ORAL | 0 refills | Status: DC

## 2019-06-08 MED ORDER — AZITHROMYCIN 250 MG PO TABS
500.0000 mg | ORAL_TABLET | Freq: Once | ORAL | Status: AC
  Administered 2019-06-08: 17:00:00 500 mg via ORAL
  Filled 2019-06-08: qty 2

## 2019-06-08 MED ORDER — IBUPROFEN 800 MG PO TABS
800.0000 mg | ORAL_TABLET | Freq: Once | ORAL | Status: AC
  Administered 2019-06-08: 800 mg via ORAL
  Filled 2019-06-08: qty 1

## 2019-06-08 NOTE — Discharge Instructions (Signed)
Your testing shows that you have pneumonia in your right lung.  Your blood work was normal.  Please take the medications as prescribed including Augmentin and Zithromax.  Seek medical exam for severe or worsening symptoms.  This includes increasing shortness of breath, chest pain, difficulty breathing or fevers that will not come down with Tylenol or ibuprofen.  Your coronavirus test was negative.

## 2019-06-08 NOTE — ED Provider Notes (Signed)
[facility] EMERGENCY DEPARTMENT Provider Note   CSN: [id number] Arrival date & time: 06/08/19  1437     History   Chief Complaint Chief Complaint  Patient presents with  . Fever    HPI Russell Odom is a 63 y.o. male.     HPI  This patient is a 63 year old male, known history of high blood pressure as well as hypothyroidism.  He presents to the hospital today with a complaint of a fever which is been going on for several days.  He initially noticed this to be between 101 and 102 and has been taking intermittent Tylenol with some transient relief.  Last night he became very sweaty because of the fever that was breaking.  He denies nausea vomiting or diarrhea, there is no rectal bleeding rashes or swelling, there is no abdominal pain or chest pain, he does have coughing, some shortness of breath as well as a sore throat and a runny nose.  He was tested a couple of days ago for COVID in 1 of the drive-through test at the pharmacy and reports that this was negative.  His wife at home has had no symptoms, his coworkers at work of had no symptoms, he has not been bringing up any phlegm.  He denies smoking or prior lung history, no prior heart history.  He has had a prior stroke and takes Aggrenox  Past Medical History:  Diagnosis Date  . Arthritis, gouty   . 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 History of esophageal dilatation   . History of kidney stones   . HTN (hypertension)   . Hypothyroid   . Mixed hyperlipidemia     Patient Active Problem List   Diagnosis Date Noted  . 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 Hematochezia 10/19/2012  . Family history of colon cancer 10/19/2012  . UNSPECIFIED  HYPOTHYROIDISM 06/21/2009  . 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 HYPERLIPIDEMIA, MIXED 06/21/2009  . GOUT 06/21/2009  . Essential hypertension 06/21/2009    Past Surgical History:  Procedure Laterality Date  . BIOPSY  02/01/2018   Procedure: BIOPSY;  Surgeon: Daneil Dolin, MD;  Location: [facility];  Service: Endoscopy;;  ileocecal valve  . COLONOSCOPY    07/04/2003   RMR: anal canal hemorrhoids, normal colon  . COLONOSCOPY  11/02/2012   Procedure: COLONOSCOPY;  Surgeon: Daneil Dolin, MD;  Location: [facility];  Service: Endoscopy;  Laterality: N/A;  9:30  . COLONOSCOPY N/A 02/01/2018   Procedure: COLONOSCOPY;  Surgeon: Daneil Dolin, MD;  Location: [facility];  Service: Endoscopy;  Laterality: N/A;  9:45  . ESOPHAGOGASTRODUODENOSCOPY  07/04/2003   RMR: Multiple linear esophageal erosions consistent with mild-to-moderate  erosive reflux esophagitis, status post passage of a 57 F Maloney dilator/ Multiple antral and duodenal bulbar erosions; otherwise, the remainder of the stomach and the first and second portions of the duodenum appeared normal  . EXTRACORPOREAL SHOCK WAVE LITHOTRIPSY Right 11/13/2018   Procedure: EXTRACORPOREAL SHOCK WAVE LITHOTRIPSY (ESWL);  Surgeon: Lucas Mallow, MD;  Location: [facility];  Service: Urology;  Laterality: Right;  . EXTRACORPOREAL SHOCK WAVE LITHOTRIPSY Right 01/29/2019   Procedure: EXTRACORPOREAL SHOCK WAVE LITHOTRIPSY (ESWL);  Surgeon: Marton Redwood  III, MD;  Location: [facility];  Service: Urology;  Laterality: Right;        Home Medications    Prior to Admission medications   Medication Sig Start Date End Date Taking? Authorizing Provider  Acetaminophen (TYLENOL PO) Take 2 tablets by mouth every 4 (four) hours as needed (fever).   Yes [provider]  allopurinol (ZYLOPRIM) 300 MG tablet Take 300 mg by mouth daily. 04/01/15  Yes [provider]  amLODipine-benazepril (LOTREL) 5-40 MG per capsule Take 1  capsule by mouth daily.   Yes [provider]  dipyridamole-aspirin (AGGRENOX) 200-25 MG per 12 hr capsule Take 1 capsule by mouth 2 (two) times daily.   Yes [provider]  levothyroxine (SYNTHROID, LEVOTHROID) 50 MCG tablet Take 50 mcg by mouth daily before breakfast.    Yes [provider]  lovastatin (MEVACOR) 40 MG tablet Take 40 mg by mouth at bedtime.  03/26/14  Yes [provider]  MEGARED OMEGA-3 KRILL OIL 500 MG CAPS Take 500 mg by mouth at bedtime.   Yes [provider]  Multiple Vitamins-Minerals (MULTIVITAMINS THER. W/MINERALS) TABS Take 1 tablet by mouth daily.   Yes [provider]  niacin 500 MG tablet Take 500 mg by mouth at bedtime.   Yes [provider]  tamsulosin (FLOMAX) 0.4 MG CAPS capsule Take 0.8 mg by mouth daily.    Yes [provider]  amoxicillin-clavulanate (AUGMENTIN) 875-125 MG tablet Take 1 tablet by mouth every 12 (twelve) hours. 06/08/19   Noemi Chapel, MD  azithromycin (ZITHROMAX Z-PAK) 250 MG tablet Take 1 tablet (250 mg total) by mouth daily. 500mg  PO day 1, then 250mg  PO days 205 06/08/19   Noemi Chapel, MD    Family History Family History  Problem Relation Age of Onset  . Colon cancer Sister 57    Social History Social History   Tobacco Use  . Smoking status: Never Smoker  . Smokeless tobacco: Never Used  Substance Use Topics  . Alcohol use: No  . Drug use: No     Allergies   Patient has no known allergies.   Review of Systems Review of Systems  All other systems reviewed and are negative.    Physical Exam Updated Vital Signs BP (!) 152/82 (BP Location: Left Arm)   Pulse 94   Temp (!) 101.3 F (38.5 C) (Oral)   Resp (!) 24   Ht 1.854 m (6\' 1" )   Wt 109.8 kg   SpO2 95%   BMI 31.93 kg/m   Physical Exam Vitals signs and nursing note reviewed.  Constitutional:      General: He is not in acute distress.    Appearance: He is well-developed.  HENT:      Head: Normocephalic and atraumatic.     Mouth/Throat:     Pharynx: No oropharyngeal exudate.  Eyes:     General: No scleral icterus.       Right eye: No discharge.        Left eye: No discharge.     Conjunctiva/sclera: Conjunctivae normal.     Pupils: Pupils are equal, round, and reactive to light.  Neck:     Musculoskeletal: Normal range of motion and neck supple.     Thyroid: No thyromegaly.     Vascular: No JVD.  Cardiovascular:     Rate and Rhythm: Normal rate and regular rhythm.     Heart sounds: Normal heart sounds. No murmur. No friction rub. No gallop.   Pulmonary:  Effort: Pulmonary effort is normal. No respiratory distress.     Breath sounds: Rales ( Rales are present in the right lung at the base in the mid lung.  Left lung is normal) present. No wheezing.     Comments: The patient speaks in full sentences, no tachypnea, sats of 96 to 97% on room air, no increased work of breathing or accessory muscle use Abdominal:     General: Bowel sounds are normal. There is no distension.     Palpations: Abdomen is soft. There is no mass.     Tenderness: There is no abdominal tenderness.  Musculoskeletal: Normal range of motion.        General: No tenderness.  Lymphadenopathy:     Cervical: No cervical adenopathy.  Skin:    General: Skin is warm and dry.     Findings: No erythema or rash.  Neurological:     Mental Status: He is alert.     Coordination: Coordination normal.  Psychiatric:        Behavior: Behavior normal.      ED Treatments / Results  Labs (all labs ordered are listed, but only abnormal results are displayed) Labs Reviewed  COMPREHENSIVE METABOLIC PANEL - Abnormal; Notable for the following components:      Result Value   Glucose, Bld 131 (*)    BUN 25 (*)    Calcium 8.8 (*)    All other components within normal limits  CBC WITH DIFFERENTIAL/PLATELET - Abnormal; Notable for the following components:   RBC 4.18 (*)    Neutro Abs 8.0 (*)    All  other components within normal limits  URINALYSIS, ROUTINE W REFLEX MICROSCOPIC - Abnormal; Notable for the following components:   Color, Urine AMBER (*)    APPearance HAZY (*)    Protein, ur 100 (*)    Bacteria, UA RARE (*)    All other components within normal limits  SARS CORONAVIRUS 2 (HOSPITAL ORDER, [facility])  CULTURE, BLOOD (ROUTINE X 2)  CULTURE, BLOOD (ROUTINE X 2)  URINE CULTURE  LACTIC ACID, PLASMA  PROTIME-INR  APTT    EKG None  Radiology Dg Chest Port 1 View  Result Date: 06/08/2019 CLINICAL DATA:  Sepsis and fever EXAM: PORTABLE CHEST 1 VIEW COMPARISON:  None. FINDINGS: Mildly low volume film. Airspace opacities within the RIGHT mid and lower lung noted, suspicious for pneumonia. Minimal LEFT basilar atelectasis noted. Cardiomediastinal silhouette is unremarkable. No definite pleural effusion or pneumothorax. No acute bony abnormalities identified. IMPRESSION: RIGHT mid and lower lung airspace opacities likely representing pneumonia. Electronically Signed   By: Margarette Canada M.D.   On: 06/08/2019 16:55    Procedures Procedures (including critical care time)  Medications Ordered in ED Medications  cefTRIAXone (ROCEPHIN) 1 g in sodium chloride 0.9 % 100 mL IVPB (0 g Intravenous Stopped 06/08/19 1749)  azithromycin (ZITHROMAX) tablet 500 mg (500 mg Oral Given 06/08/19 1721)  ibuprofen (ADVIL) tablet 800 mg (800 mg Oral Given 06/08/19 1753)     Initial Impression / Assessment and Plan / ED Course  I have reviewed the triage vital signs and the nursing notes.  Pertinent labs & imaging results that were available during my care of the patient were reviewed by me and considered in my medical decision making (see chart for details).  Clinical Course as of Jun 07 1820  Fri Jun 08, 2019  1819 Have personally looked at the chest x-ray and my interpretation is consistent with right-sided  pneumonia.  This is a focal infiltrate, Covid test is  negative, the rest of the labs are unremarkable and the patient has been given antibiotics.  He is stable for discharge, he is agreeable to the plan.  Oxygenating normally on room air   [BM]    Clinical Course User Index [BM] Noemi Chapel, MD       The patient is very well-appearing despite the presence of intermittent fevers, he is 101.3 here with a pulse of 100, blood pressure of 123/80, normal oxygenation.  He has abnormal lung sounds suggesting infection, he will be retested for COVID as well as an x-ray for pneumonia, lab work, he does not appear to be in respiratory distress or needing supplemental oxygen at this time.  I have personally viewed the x-ray, there does appear to be a right-sided infiltrate, there is not appear to be anything on the left side.  This is consistent with the patient's exam.  He does not have a leukocytosis, lactic acid is normal, INR is normal, antibiotics will be started, covid test pending.  Final Clinical Impressions(s) / ED Diagnoses   Final diagnoses:  Community acquired pneumonia of right lower lobe of lung [facility])    ED Discharge Orders         Ordered    azithromycin (ZITHROMAX Z-PAK) 250 MG tablet  Daily     06/08/19 1820    amoxicillin-clavulanate (AUGMENTIN) 875-125 MG tablet  Every 12 hours     06/08/19 1820           Noemi Chapel, MD 06/08/19 1821

## 2019-06-08 NOTE — ED Triage Notes (Signed)
Patient complains of fever since Tuesday.  Patient had had a covid test at [facility] in [place] on Wednesday with negative results.  Patient has been taking 1 gram of tylenol every 4-6 since Tuesday.  Patient last dose today at 1200.  Patient states he has had nonproductive cough as well.

## 2019-06-10 LAB — URINE CULTURE: Culture: 4000 — AB

## 2019-06-13 LAB — CULTURE, BLOOD (ROUTINE X 2)
Culture: NO GROWTH
Culture: NO GROWTH

## 2019-07-23 ENCOUNTER — Other Ambulatory Visit: Payer: Self-pay

## 2019-07-23 ENCOUNTER — Encounter: Payer: Self-pay | Admitting: Cardiology

## 2019-07-23 ENCOUNTER — Ambulatory Visit (INDEPENDENT_AMBULATORY_CARE_PROVIDER_SITE_OTHER): Payer: PPO | Admitting: Cardiology

## 2019-07-23 VITALS — BP 117/66 | HR 61 | Ht 72.0 in | Wt 232.7 lb

## 2019-07-23 DIAGNOSIS — I10 Essential (primary) hypertension: Secondary | ICD-10-CM

## 2019-07-23 DIAGNOSIS — R42 Dizziness and giddiness: Secondary | ICD-10-CM | POA: Diagnosis not present

## 2019-07-23 DIAGNOSIS — E782 Mixed hyperlipidemia: Secondary | ICD-10-CM

## 2019-07-23 DIAGNOSIS — Z8673 Personal history of transient ischemic attack (TIA), and cerebral infarction without residual deficits: Secondary | ICD-10-CM

## 2019-07-23 DIAGNOSIS — R7303 Prediabetes: Secondary | ICD-10-CM

## 2019-07-23 MED ORDER — BENAZEPRIL HCL 40 MG PO TABS: 20.0000 mg | ORAL_TABLET | Freq: Every day | ORAL | 1 refills | Status: DC

## 2019-07-23 MED ORDER — BENAZEPRIL HCL 40 MG PO TABS: 40.0000 mg | ORAL_TABLET | Freq: Every day | ORAL | 1 refills | Status: DC

## 2019-07-23 NOTE — Progress Notes (Signed)
Primary Physician/Referring:  Sofie Rower, PA-C  Patient ID: Russell Odom, male    DOB: Oct 24, 1955, 63 y.o.   MRN: [id number]  Chief Complaint  Patient presents with  . postural dizziness  . New Patient (Initial Visit)   HPI:    Russell Odom  is a 63 y.o. 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 Requesting, symptoms of been ongoing for the past 4-6 months, most of the episodes occur when he suddenly stands up or starts her physical activity.  He is never happened when he is fairly active and walking around at work place.  No associated symptoms of dyspnea, chest pain or palpitations.  He is a nonsmoker.  Patient noticed symptoms of dizziness when he climbs a flight of stairs or does exertional activity, but no other associated symptoms. Patient evaluated by neurology Dr. Leonie Man in July for dizziness and decreased memory.  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  Past Medical History:  Diagnosis Date  . Arthritis, gouty   . 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 History of esophageal dilatation   . History of kidney stones   . HTN (hypertension)   . Hypothyroid   . Mixed hyperlipidemia    Past Surgical History:  Procedure Laterality Date  . BIOPSY  02/01/2018   Procedure: BIOPSY;  Surgeon: Daneil Dolin, MD;  [facility];  Service: Endoscopy;;  ileocecal valve  . COLONOSCOPY    07/04/2003   RMR: anal canal hemorrhoids, normal  colon  . COLONOSCOPY  11/02/2012   Procedure: COLONOSCOPY;  Surgeon: Daneil Dolin, MD;  [facility];  Service: Endoscopy;  Laterality: N/A;  9:30  . COLONOSCOPY N/A 02/01/2018   Procedure: COLONOSCOPY;  Surgeon: Daneil Dolin, MD;  [facility];  Service: Endoscopy;  Laterality: N/A;  9:45  . ESOPHAGOGASTRODUODENOSCOPY  07/04/2003   RMR: Multiple linear esophageal erosions consistent with mild-to-moderate  erosive reflux esophagitis, status post passage of a 86 F Maloney dilator/ Multiple antral and duodenal bulbar erosions; otherwise, the remainder of the stomach and the first and second portions of the duodenum appeared normal  . EXTRACORPOREAL SHOCK WAVE LITHOTRIPSY Right 11/13/2018   Procedure: EXTRACORPOREAL SHOCK WAVE LITHOTRIPSY (ESWL);  Surgeon: Lucas Mallow, MD;  Location: [facility];  Service: Urology;  Laterality: Right;  . EXTRACORPOREAL SHOCK WAVE LITHOTRIPSY Right 01/29/2019   Procedure: EXTRACORPOREAL SHOCK WAVE LITHOTRIPSY (ESWL);  Surgeon: Lucas Mallow, MD;  Location: [facility];  Service: Urology;  Laterality: Right;   Social History   Socioeconomic History  . Marital status: Married    Spouse name: Russell Odom   . Number of children: 3  . Years of education: 64  . Highest education level: Not on file  Occupational History  . Occupation: Librarian, academic at [employer]  . Occupation: SUPERVISOR     Employer: [employer]  . Financial resource strain: Not on file  . Food insecurity  Worry: Not on file    Inability: Not on file  . Transportation needs    Medical: Not on file    Non-medical: Not on file  Tobacco Use  . Smoking status: Never Smoker  . Smokeless tobacco: Never Used  Substance and Sexual Activity  . Alcohol use: No  . Drug use: No  . Sexual activity: Not on file  Lifestyle  . Physical activity    Days per week: Not on file    Minutes per session: Not on file  . Stress: Not on file  Relationships  . Social  Herbalist on phone: Not on file    Gets together: Not on file    Attends religious service: Not on file    Active member of club or organization: Not on file    Attends meetings of clubs or organizations: Not on file    Relationship status: Not on file  . Intimate partner violence    Fear of current or ex partner: Not on file    Emotionally abused: Not on file    Physically abused: Not on file    Forced sexual activity: Not on file  Other Topics Concern  . Not on file  Social History Narrative   Patient lives at home with wife Russell Odom.    Patient has 3 daughters.    Patient has a high school education.    Patient is right haneded.    Patient is working at [employer].    ROS  Review of Systems  Constitution: Negative for chills, decreased appetite, malaise/fatigue and weight gain.  Cardiovascular: Negative for dyspnea on exertion, leg swelling and syncope.  Endocrine: Negative for cold intolerance.  Hematologic/Lymphatic: Does not bruise/bleed easily.  Musculoskeletal: Negative for joint swelling.  Gastrointestinal: Negative for abdominal pain, anorexia, change in bowel habit, hematochezia and melena.  Neurological: Positive for dizziness. Negative for headaches and light-headedness.  Psychiatric/Behavioral: Positive for memory loss. Negative for depression and substance abuse.  All other systems reviewed and are negative.  Objective   Vitals with BMI 07/23/2019 06/08/2019 06/08/2019  Height 6' 0"  - -  Weight 232 lbs 11 oz - -  BMI 93.79 - -  Systolic 024 097 -  Diastolic 66 82 -  Pulse 61 94 95    Blood pressure 117/66, pulse 61, height 6' (1.829 m), weight 232 lb 11.2 oz (105.6 kg), SpO2 97 %. Body mass index is 31.56 kg/m.   Orthostatic VS for the past 24 hrs (Last 3 readings):  BP- Lying Pulse- Lying BP- Sitting Pulse- Sitting BP- Standing at 0 minutes Pulse- Standing at 0 minutes  07/23/19 0943 125/79 59 110/71 66 108/62 71    Physical Exam   Constitutional: He appears well-developed and well-nourished. No distress.  HENT:  Head: Atraumatic.  Eyes: Conjunctivae are normal.  Neck: Neck supple. No JVD present. No thyromegaly present.  Cardiovascular: Normal rate, regular rhythm, normal heart sounds and intact distal pulses. Exam reveals no gallop.  No murmur heard. No leg edema, no JVD.  Pulmonary/Chest: Effort normal and breath sounds normal.  Abdominal: Soft. Bowel sounds are normal.  Musculoskeletal: Normal range of motion.  Neurological: He is alert.  Skin: Skin is warm and dry.  Psychiatric: He has a normal mood and affect.   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 These have increased in size compared to 2007. - Minimal scattered periventricular and subcortical foci  of non-specific gliosis.  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   Laboratory examination:   Labs 10/21/2018: A1c 6.2%.  PSA normal.  HB 15.5/HCT 44.2, platelets 256.  Serum glucose 98 mg, BUN 14, creatinine 0.98, eGFR 81 mL, potassium 5.3.  CMP otherwise normal.  Total cholesterol 291, triglycerides 125, HDL 45, LDL 122.  Non-HDL cholesterol 156.  Recent Labs    06/08/19 1607  NA 136  K 4.0  CL 103  CO2 23  GLUCOSE 131*  BUN 25*  CREATININE 1.24  CALCIUM 8.8*  GFRNONAA >60  GFRAA >60   CMP Latest Ref Rng & Units 06/08/2019  Glucose 70 - 99 mg/dL 131(H)  BUN 8 - 23 mg/dL 25(H)  Creatinine 0.61 - 1.24 mg/dL 1.24  Sodium 135 - 145 mmol/L 136  Potassium 3.5 - 5.1 mmol/L 4.0  Chloride 98 - 111 mmol/L 103  CO2 22 - 32 mmol/L 23  Calcium 8.9 - 10.3 mg/dL 8.8(L)  Total Protein 6.5 - 8.1 g/dL 7.9  Total Bilirubin 0.3 - 1.2 mg/dL 0.9  Alkaline Phos 38 - 126 U/L 86  AST 15 - 41 U/L 21  ALT 0 - 44 U/L 23   CBC Latest Ref Rng & Units 06/08/2019 05/08/2019  WBC 4.0 - 10.5 K/uL 10.2 WILL FOLLOW  Hemoglobin 13.0 - 17.0 g/dL 13.6 WILL  FOLLOW  Hematocrit 39.0 - 52.0 % 40.7 WILL FOLLOW  Platelets 150 - 400 K/uL 245 WILL FOLLOW   Lipid Panel  No results found for: CHOL, TRIG, HDL, CHOLHDL, VLDL, LDLCALC, LDLDIRECT HEMOGLOBIN A1C No results found for: HGBA1C, MPG TSH Recent Labs    05/08/19 1551  TSH 4.630*   Medications and allergies  No Known Allergies   Prior to Admission medications   Medication Sig Start Date End Date Taking? Authorizing Provider  Acetaminophen (TYLENOL PO) Take 2 tablets by mouth every 4 (four) hours as needed (fever).   Yes [provider]  allopurinol (ZYLOPRIM) 300 MG tablet Take 300 mg by mouth daily. 04/01/15  Yes [provider]  amLODipine-benazepril (LOTREL) 5-40 MG per capsule Take 1 capsule by mouth daily.   Yes [provider]  levothyroxine (SYNTHROID, LEVOTHROID) 50 MCG tablet Take 50 mcg by mouth daily before breakfast.    Yes [provider]  lovastatin (MEVACOR) 40 MG tablet Take 40 mg by mouth at bedtime.  03/26/14  Yes [provider]  MEGARED OMEGA-3 KRILL OIL 500 MG CAPS Take 500 mg by mouth at bedtime.   Yes [provider]  Multiple Vitamins-Minerals (MULTIVITAMINS THER. W/MINERALS) TABS Take 1 tablet by mouth daily.   Yes [provider]  niacin 500 MG tablet Take 500 mg by mouth at bedtime.   Yes [provider]  tamsulosin (FLOMAX) 0.4 MG CAPS capsule Take 0.8 mg by mouth daily.    Yes [provider]     Current Outpatient Medications  Medication Instructions  . Acetaminophen (TYLENOL PO) 2 tablets, Oral, Every 4 hours PRN  . allopurinol (ZYLOPRIM) 300 mg, Oral, Daily  . dipyridamole-aspirin (AGGRENOX) 200-25 MG 12hr capsule 1 capsule, Oral, 2 times daily  . levothyroxine (SYNTHROID) 50 mcg, Oral, Daily before breakfast  . lovastatin (MEVACOR) 40 mg, Oral, Daily at bedtime  . MegaRed Omega-3 Krill Oil 500 mg, Oral, Daily at bedtime  . Multiple Vitamins-Minerals (MULTIVITAMINS THER.  W/MINERALS) TABS 1 tablet, Daily  . niacin 500 mg, Oral, Daily at bedtime  . tamsulosin (FLOMAX) 0.8 mg, Oral, Daily    Cardiac  Studies:   None  Assessment     ICD-10-CM   1. Postural dizziness  R42 EKG 12-Lead  2. H/O stroke without residual deficits  Z86.73   3. HYPERLIPIDEMIA, MIXED  E78.2   4. Essential hypertension  I10   5. Pre-diabetes  R73.03     EKG 07/23/2019: Normal sinus rhythm at the rate of 66 bpm, rightward axis, incomplete right bundle branch block.  Single PVC.  Low-voltage complexes.  Recommendations:   Patient with history of stroke without significant Crestor defects since 2007, hypertension, mixed Lipidemia, hyperglycemia, referred to me for evaluation of exertional dizziness.    Patient referred to me for evaluation of postural dizziness, on further questioning, symptoms appear to be related to orthostatic hypotension, he was mildly orthostatic today and office.  I have discontinued benazepril/amlodipine combination and switched him to plain benazepril 40 mg daily.  I reviewed his chart, extremely high risk for future cardiovascular events.  He is already had a stroke in the past, fortunately no significant deficits, he needs very aggressive risk factor modification.  He has hyperglycemia, hyperlipidemia that is mixed variety, I would prefer to place him on a high intensity high-dose statins.  Obtain baseline I have repeated all his labs including LPA, lipid profile testing, A1c and TSH.  I'll obtain an echocardiogram and also routine treadmill exercise stress test.  I'll like to see him back in 6 weeks for follow-up.  Adrian Prows, MD, [facility] 07/23/2019, 10:20 AM [facility]. [place] Pager: [phone] Office: [phone] If no answer Cell [phone]

## 2019-07-31 ENCOUNTER — Ambulatory Visit (INDEPENDENT_AMBULATORY_CARE_PROVIDER_SITE_OTHER): Payer: PPO

## 2019-07-31 ENCOUNTER — Other Ambulatory Visit: Payer: Self-pay

## 2019-07-31 DIAGNOSIS — I10 Essential (primary) hypertension: Secondary | ICD-10-CM | POA: Diagnosis not present

## 2019-07-31 DIAGNOSIS — R42 Dizziness and giddiness: Secondary | ICD-10-CM | POA: Diagnosis not present

## 2019-08-07 LAB — LIPOPROTEIN A (LPA): Lipoprotein (a): 25.9 nmol/L (ref <75.0)

## 2019-08-07 LAB — LIPID PANEL WITH LDL/HDL RATIO
Cholesterol, Total: 165 mg/dL (ref 100–199)
HDL: 42 mg/dL (ref >39)
LDL Chol Calc (NIH): 98 mg/dL (ref 0–99)
LDL/HDL Ratio: 2.3 ratio (ref 0.0–3.6)
Triglycerides: 139 mg/dL (ref 0–149)
VLDL Cholesterol Cal: 25 mg/dL (ref 5–40)

## 2019-08-07 LAB — HGB A1C W/O EAG: Hgb A1c MFr Bld: 5.6 % (ref 4.8–5.6)

## 2019-08-07 LAB — APO A1 + B + RATIO
Apolipo. B/A-1 Ratio: 0.8 ratio — ABNORMAL HIGH (ref 0.0–0.7)
Apolipoprotein A-1: 132 mg/dL (ref 101–178)
Apolipoprotein B: 107 mg/dL — ABNORMAL HIGH (ref <90)

## 2019-08-07 LAB — LDL CHOLESTEROL, DIRECT: LDL Direct: 110 mg/dL — ABNORMAL HIGH (ref 0–99)

## 2019-08-07 LAB — TSH: TSH: 4.25 u[IU]/mL (ref 0.450–4.500)

## 2019-08-15 ENCOUNTER — Other Ambulatory Visit: Payer: Self-pay

## 2019-08-15 ENCOUNTER — Ambulatory Visit (INDEPENDENT_AMBULATORY_CARE_PROVIDER_SITE_OTHER): Payer: PPO

## 2019-08-15 DIAGNOSIS — R42 Dizziness and giddiness: Secondary | ICD-10-CM

## 2019-09-03 ENCOUNTER — Ambulatory Visit (INDEPENDENT_AMBULATORY_CARE_PROVIDER_SITE_OTHER): Payer: PPO | Admitting: Cardiology

## 2019-09-03 ENCOUNTER — Other Ambulatory Visit: Payer: Self-pay

## 2019-09-03 ENCOUNTER — Encounter: Payer: Self-pay | Admitting: Cardiology

## 2019-09-03 VITALS — Ht 73.0 in | Wt 229.2 lb

## 2019-09-03 DIAGNOSIS — Z8673 Personal history of transient ischemic attack (TIA), and cerebral infarction without residual deficits: Secondary | ICD-10-CM | POA: Diagnosis not present

## 2019-09-03 DIAGNOSIS — I10 Essential (primary) hypertension: Secondary | ICD-10-CM | POA: Diagnosis not present

## 2019-09-03 DIAGNOSIS — R42 Dizziness and giddiness: Secondary | ICD-10-CM | POA: Diagnosis not present

## 2019-09-03 DIAGNOSIS — E782 Mixed hyperlipidemia: Secondary | ICD-10-CM | POA: Diagnosis not present

## 2019-09-03 MED ORDER — BENAZEPRIL HCL 20 MG PO TABS: 20.0000 mg | ORAL_TABLET | Freq: Two times a day (BID) | ORAL | 1 refills | Status: DC

## 2019-09-03 MED ORDER — BENAZEPRIL HCL 20 MG PO TABS: 40.0000 mg | ORAL_TABLET | Freq: Two times a day (BID) | ORAL | 1 refills | Status: DC

## 2019-09-03 MED ORDER — ROSUVASTATIN CALCIUM 20 MG PO TABS: 20.0000 mg | ORAL_TABLET | Freq: Every day | ORAL | 2 refills | Status: DC

## 2019-09-03 NOTE — Progress Notes (Signed)
Primary Physician/Referring:  Sofie Rower, PA-C  Patient ID: Russell Odom, male    DOB: [birth year], 63 y.o.   MRN: [id number]  Chief Complaint  Patient presents with  . Hypertension  . Dizziness   HPI:    Russell Odom  is a 63 y.o. 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  Patient was seen by me on 07/23/2019, on his last office visit due to dizziness which I felt were related to orthostasis, I had switched him from benazepril HCT to plain benazepril.  He has noticed marked improvement in symptoms or dizziness.  No chest pain or dyspnea or palpitations.   Past Medical History:  Diagnosis Date  . Arthritis, gouty   . 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 History of esophageal dilatation   . History of kidney stones   . HTN (hypertension)   . Hypothyroid   . Mixed hyperlipidemia    Past Surgical History:  Procedure Laterality Date  . BIOPSY  02/01/2018   Procedure: BIOPSY;  Surgeon: Daneil Dolin, MD;  [facility];  Service: Endoscopy;;  ileocecal valve  . COLONOSCOPY    07/04/2003   RMR: anal canal hemorrhoids, normal colon  . COLONOSCOPY  11/02/2012   Procedure: COLONOSCOPY;  Surgeon: Daneil Dolin, MD;  [facility];  Service: Endoscopy;  Laterality: N/A;  9:30  . COLONOSCOPY N/A 02/01/2018   Procedure: COLONOSCOPY;  Surgeon: Daneil Dolin, MD;  [facility];  Service: Endoscopy;  Laterality: N/A;  9:45  . ESOPHAGOGASTRODUODENOSCOPY  07/04/2003   RMR: Multiple linear esophageal erosions consistent with mild-to-moderate  erosive reflux esophagitis, status post passage of a  63 F Maloney dilator/ Multiple antral and duodenal bulbar erosions; otherwise, the remainder of the stomach and the first and second portions of the duodenum appeared normal  . EXTRACORPOREAL SHOCK WAVE LITHOTRIPSY Right 11/13/2018   Procedure: EXTRACORPOREAL SHOCK WAVE LITHOTRIPSY (ESWL);  Surgeon: Lucas Mallow, MD;  [facility];  Service: Urology;  Laterality: Right;  . EXTRACORPOREAL SHOCK WAVE LITHOTRIPSY Right 01/29/2019   Procedure: EXTRACORPOREAL SHOCK WAVE LITHOTRIPSY (ESWL);  Surgeon: Lucas Mallow, MD;  Location: [facility];  Service: Urology;  Laterality: Right;   Social History   Socioeconomic History  . Marital status: Married    Spouse name: [name]   . Number of children: 3  . Years of education: 44  . Highest education level: Not on file  Occupational History  . Occupation: Librarian, academic at [employer]  . Occupation: SUPERVISOR     Employer: [employer]  . Financial resource strain: Not on file  . Food insecurity    Worry: Not on file    Inability: Not on file  . Transportation needs    Medical: Not on file    Non-medical: Not on file  Tobacco Use  . Smoking status: Never Smoker  . Smokeless tobacco: Never Used  Substance and Sexual Activity  . Alcohol use: No  . Drug use: No  . Sexual activity: Not on file  Lifestyle  . Physical activity    Days per week: Not on file  Minutes per session: Not on file  . Stress: Not on file  Relationships  . Social Herbalist on phone: Not on file    Gets together: Not on file    Attends religious service: Not on file    Active member of club or organization: Not on file    Attends meetings of clubs or organizations: Not on file    Relationship status: Not on file  . Intimate partner violence    Fear of current or ex partner: Not on file    Emotionally abused: Not on file    Physically abused: Not on file    Forced sexual activity: Not on file  Other Topics Concern  . Not on  file  Social History Narrative   Patient lives at home with wife [name].    Patient has 3 daughters.    Patient has a high school education.    Patient is right haneded.    Patient is working at [employer].    ROS  Review of Systems  Constitution: Negative for chills, decreased appetite, malaise/fatigue and weight gain.  Cardiovascular: Negative for dyspnea on exertion, leg swelling and syncope.  Endocrine: Negative for cold intolerance.  Hematologic/Lymphatic: Does not bruise/bleed easily.  Musculoskeletal: Negative for joint swelling.  Gastrointestinal: Negative for abdominal pain, anorexia, change in bowel habit, hematochezia and melena.  Neurological: Positive for dizziness (improved). Negative for headaches and light-headedness.  Psychiatric/Behavioral: Positive for memory loss. Negative for depression and substance abuse.  All other systems reviewed and are negative.  Objective   Vitals with BMI 09/03/2019 07/23/2019 06/08/2019  Height 6' 1"  6' 0"  -  Weight 229 lbs 3 oz 232 lbs 11 oz -  BMI 65.03 54.65 -  Systolic - 681 275  Diastolic - 66 82  Pulse - 61 94     09/03/19 11:41 AM  09/03/19 11:43 AM  09/03/19 11:44 AM     Orthostatic BP  138/75  136/79  125/79   BP Location  LeftArm  LeftArm  LeftArm   Patient Position  Supine  Sitting  Standing   Cuff Size  Normal  Normal  Normal   Orthostatic Pulse  58  65  65   SpO2  99%  99%  99%   Physical Exam  Constitutional: He appears well-developed and well-nourished. No distress.  HENT:  Head: Atraumatic.  Eyes: Conjunctivae are normal.  Neck: Neck supple. No JVD present. No thyromegaly present.  Cardiovascular: Normal rate, regular rhythm, normal heart sounds and intact distal pulses. Exam reveals no gallop.  No murmur heard. No leg edema, no JVD.  Pulmonary/Chest: Effort normal and breath sounds normal.  Abdominal: Soft. Bowel sounds are normal.  Musculoskeletal: Normal range of motion.   Neurological: He is alert.  Skin: Skin is warm and dry.  Psychiatric: He has a normal mood and affect.   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 These have increased in size compared to 2007. - Minimal scattered periventricular and subcortical foci of non-specific gliosis.  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  Laboratory examination:   Labs 10/21/2018: A1c 6.2%.  PSA normal.  HB 15.5/HCT 44.2, platelets 256.  Serum glucose 98 mg, BUN 14, creatinine 0.98, eGFR 81 mL, potassium 5.3.  CMP otherwise normal.  Total cholesterol 291, triglycerides 125, HDL 45, LDL 122.  Non-HDL cholesterol 156.  Recent Labs    06/08/19 1607  NA  136  K 4.0  CL 103  CO2 23  GLUCOSE 131*  BUN 25*  CREATININE 1.24  CALCIUM 8.8*  GFRNONAA >60  GFRAA >60   CMP Latest Ref Rng & Units 06/08/2019  Glucose 70 - 99 mg/dL 131(H)  BUN 8 - 23 mg/dL 25(H)  Creatinine 0.61 - 1.24 mg/dL 1.24  Sodium 135 - 145 mmol/L 136  Potassium 3.5 - 5.1 mmol/L 4.0  Chloride 98 - 111 mmol/L 103  CO2 22 - 32 mmol/L 23  Calcium 8.9 - 10.3 mg/dL 8.8(L)  Total Protein 6.5 - 8.1 g/dL 7.9  Total Bilirubin 0.3 - 1.2 mg/dL 0.9  Alkaline Phos 38 - 126 U/L 86  AST 15 - 41 U/L 21  ALT 0 - 44 U/L 23   CBC Latest Ref Rng & Units 06/08/2019 05/08/2019  WBC 4.0 - 10.5 K/uL 10.2 WILL FOLLOW  Hemoglobin 13.0 - 17.0 g/dL 13.6 WILL FOLLOW  Hematocrit 39.0 - 52.0 % 40.7 WILL FOLLOW  Platelets 150 - 400 K/uL 245 WILL FOLLOW   Lipid Panel     Component Value Date/Time   CHOL 165 08/06/2019 0934   TRIG 139 08/06/2019 0934   HDL 42 08/06/2019 0934   LDLCALC 98 08/06/2019 0934   LDLDIRECT 110 (H) 08/06/2019 0934    Ref Range & Units 08/06/2019  Lipoprotein (a) <75.0 nmol/L 25.9      HEMOGLOBIN A1C Lab Results  Component Value Date   HGBA1C 5.6 08/06/2019   TSH Recent Labs    05/08/19 1551 08/06/19 0934   TSH 4.630* 4.250   Medications and allergies  No Known Allergies   Prior to Admission medications   Medication Sig Start Date End Date Taking? Authorizing Provider  Acetaminophen (TYLENOL PO) Take 2 tablets by mouth every 4 (four) hours as needed (fever).   Yes [provider]  allopurinol (ZYLOPRIM) 300 MG tablet Take 300 mg by mouth daily. 04/01/15  Yes [provider]  amLODipine-benazepril (LOTREL) 5-40 MG per capsule Take 1 capsule by mouth daily.   Yes [provider]  levothyroxine (SYNTHROID, LEVOTHROID) 50 MCG tablet Take 50 mcg by mouth daily before breakfast.    Yes [provider]  lovastatin (MEVACOR) 40 MG tablet Take 40 mg by mouth at bedtime.  03/26/14  Yes [provider]  MEGARED OMEGA-3 KRILL OIL 500 MG CAPS Take 500 mg by mouth at bedtime.   Yes [provider]  Multiple Vitamins-Minerals (MULTIVITAMINS THER. W/MINERALS) TABS Take 1 tablet by mouth daily.   Yes [provider]  niacin 500 MG tablet Take 500 mg by mouth at bedtime.   Yes [provider]  tamsulosin (FLOMAX) 0.4 MG CAPS capsule Take 0.8 mg by mouth daily.    Yes [provider]     Current Outpatient Medications  Medication Instructions  . Acetaminophen (TYLENOL PO) 2 tablets, Oral, Every 4 hours PRN  . allopurinol (ZYLOPRIM) 300 mg, Oral, Daily  . benazepril (LOTENSIN) 20 mg, Oral, 2 times daily  . dipyridamole-aspirin (AGGRENOX) 200-25 MG 12hr capsule 1 capsule, Oral, 2 times daily  . levothyroxine (SYNTHROID) 50 mcg, Oral, Daily before breakfast  . MegaRed Omega-3 Krill Oil 500 mg, Oral, Daily at bedtime  . Multiple Vitamins-Minerals (MULTIVITAMINS THER. W/MINERALS) TABS 1 tablet, Daily  . rosuvastatin (CRESTOR) 20 mg, Oral, Daily  . tamsulosin (FLOMAX) 0.4 mg, Oral, Daily    Cardiac Studies:   Echocardiogram 07/31/2019: Left ventricle cavity is mildly dilated. Mild concentric hypertrophy of the left ventricle.  Normal  LV systolic function with EF 55%. Normal global wall motion. Normal diastolic filling pattern.  No significant valvular abnormality. IVC is dilated with blunted respiratory response. Estimated RA pressure 8 mmHg.  Exercise treadmill stress test 08/15/2019: Exercise treadmill stress test performed using Bruce protocol.  Patient reached 10.6 METS, and 106% of age predicted maximum heart rate.  Exercise capacity was excellent.  No chest pain reported.  Normal heart rate and hemodynamic response. Stress EKG revealed no ischemic changes.Occasional PVC's seen on stress EKG. Normal exercise treadmill stress test.  Assessment     ICD-10-CM   1. Postural dizziness  R42   2. Essential hypertension  I10 benazepril (LOTENSIN) 20 MG tablet    DISCONTINUED: benazepril (LOTENSIN) 20 MG tablet  3. Mixed hyperlipidemia  E78.2 rosuvastatin (CRESTOR) 20 MG tablet    Lipid Panel With LDL/HDL Ratio  4. H/O stroke without residual deficits  Z86.73     EKG 07/23/2019: Normal sinus rhythm at the rate of 66 bpm, rightward axis, incomplete right bundle branch block.  Single PVC.  Low-voltage complexes.  Recommendations:   Patient with history of stroke without significant defects since 2007, hypertension, mixed Lipidemia, hyperglycemia, referred to me for evaluation of exertional dizziness.  Patient was seen by me on 07/23/2019, on his last office visit due to dizziness which I felt were related to orthostasis, I had switched him from benazepril HCT to plain benazepril.  He has noticed marked improvement in symptoms or dizziness.  Orthostasis done today revealed mild orthostatic changes.  Advised him to switch benazepril from 40 mg in the morning to 20 mg b.i.d. to see if her symptoms of dizziness would even be better as he is mildly orthostatic.  With regard to hyperlipidemia, would recommend discontinuing lovastatin and also niacin and switching over to Crestor 20 mg daily.  I reviewed his echocardiogram  and also treadmill stress test.  Overall fairly normal results.  I'd like to see him back one more time with repeat lipid profile in 6-8 weeks, if he remains stable I'll see him back on a p.r.n. basis.  Adrian Prows, MD, [facility] 09/03/2019, 12:22 PM [facility]. [place] Pager: [phone] Office: [phone] If no answer Cell [phone]

## 2019-09-27 ENCOUNTER — Other Ambulatory Visit: Payer: Self-pay | Admitting: Cardiology

## 2019-09-27 DIAGNOSIS — E782 Mixed hyperlipidemia: Secondary | ICD-10-CM

## 2019-09-28 ENCOUNTER — Other Ambulatory Visit: Payer: Self-pay | Admitting: Cardiology

## 2019-09-28 DIAGNOSIS — I10 Essential (primary) hypertension: Secondary | ICD-10-CM

## 2019-10-22 DIAGNOSIS — E782 Mixed hyperlipidemia: Secondary | ICD-10-CM | POA: Diagnosis not present

## 2019-10-23 LAB — LIPID PANEL WITH LDL/HDL RATIO
Cholesterol, Total: 163 mg/dL (ref 100–199)
HDL: 50 mg/dL (ref >39)
LDL Chol Calc (NIH): 87 mg/dL (ref 0–99)
LDL/HDL Ratio: 1.7 ratio (ref 0.0–3.6)
Triglycerides: 152 mg/dL — ABNORMAL HIGH (ref 0–149)
VLDL Cholesterol Cal: 26 mg/dL (ref 5–40)

## 2019-11-05 ENCOUNTER — Other Ambulatory Visit: Payer: Self-pay

## 2019-11-05 ENCOUNTER — Ambulatory Visit (INDEPENDENT_AMBULATORY_CARE_PROVIDER_SITE_OTHER): Payer: PPO | Admitting: Cardiology

## 2019-11-05 ENCOUNTER — Encounter: Payer: Self-pay | Admitting: Cardiology

## 2019-11-05 VITALS — BP 114/73 | HR 73 | Temp 97.8°F | Resp 14 | Ht 73.0 in | Wt 240.5 lb

## 2019-11-05 DIAGNOSIS — I10 Essential (primary) hypertension: Secondary | ICD-10-CM

## 2019-11-05 DIAGNOSIS — R42 Dizziness and giddiness: Secondary | ICD-10-CM

## 2019-11-05 DIAGNOSIS — Z8673 Personal history of transient ischemic attack (TIA), and cerebral infarction without residual deficits: Secondary | ICD-10-CM | POA: Diagnosis not present

## 2019-11-05 NOTE — Progress Notes (Signed)
Primary Physician/Referring:  Sofie Rower, PA-C  Patient ID: Russell Odom, male    DOB: [birth year], 64 y.o.   MRN: [id number]  Chief Complaint  Patient presents with  . Hypertension  . Hyperlipidemia  . Follow-up    2 month   HPI:    Russell Odom  is a 64 y.o. 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  He has noticed marked improvement in symptoms or dizziness.  No chest pain or dyspnea or palpitations.  I have also discontinued niacin and also simvastatin and switch him to Crestor which he is tolerating he now presents for follow-up on labs and also hypertension dizziness.  He is feeling the best he has in quite a while and has not had any side effects from the medications.  No further dizziness.  Past Medical History:  Diagnosis Date  . Arthritis, gouty   . 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 History of esophageal dilatation   . History of kidney stones   . HTN (hypertension)   . Hypothyroid   . Mixed hyperlipidemia    Past Surgical History:  Procedure Laterality Date  . BIOPSY  02/01/2018   Procedure: BIOPSY;  Surgeon: Russell Dolin, MD;  [facility];  Service: Endoscopy;;  ileocecal valve  . COLONOSCOPY    07/04/2003   RMR: anal canal hemorrhoids, normal colon  . COLONOSCOPY  11/02/2012   Procedure: COLONOSCOPY;  Surgeon: Russell Dolin, MD;  [facility];  Service: Endoscopy;  Laterality: N/A;  9:30  . COLONOSCOPY N/A 02/01/2018   Procedure: COLONOSCOPY;  Surgeon:  Russell Dolin, MD;  [facility];  Service: Endoscopy;  Laterality: N/A;  9:45  . ESOPHAGOGASTRODUODENOSCOPY  07/04/2003   RMR: Multiple linear esophageal erosions consistent with mild-to-moderate  erosive reflux esophagitis, status post passage of a 51 F Maloney dilator/ Multiple antral and duodenal bulbar erosions; otherwise, the remainder of the stomach and the first and second portions of the duodenum appeared normal  . EXTRACORPOREAL SHOCK WAVE LITHOTRIPSY Right 11/13/2018   Procedure: EXTRACORPOREAL SHOCK WAVE LITHOTRIPSY (ESWL);  Surgeon: Russell Mallow, MD;  Location: [facility];  Service: Urology;  Laterality: Right;  . EXTRACORPOREAL SHOCK WAVE LITHOTRIPSY Right 01/29/2019   Procedure: EXTRACORPOREAL SHOCK WAVE LITHOTRIPSY (ESWL);  Surgeon: Russell Mallow, MD;  Location: [facility];  Service: Urology;  Laterality: Right;   Social History   Socioeconomic History  . Marital status: Married    Spouse name: [name]   . Number of children: 3  . Years of education: 68  . Highest education level: Not on file  Occupational History  . Occupation: Librarian, academic at [employer]  . Occupation: SUPERVISOR     Employer: [employer]  Tobacco Use  . Smoking status: Never Smoker  . Smokeless tobacco: Never Used  Substance and Sexual Activity  . Alcohol use: No  . Drug use: No  . Sexual activity: Not on file  Other Topics Concern  . Not on file  Social History Narrative  Patient lives at home with wife [name].    Patient has 3 daughters.    Patient has a high school education.    Patient is right haneded.    Patient is working at [employer].    Social Determinants of Health   Financial Resource Strain:   . Difficulty of Paying Living Expenses: Not on file  Food Insecurity:   . Worried About Charity fundraiser in the Last Year: Not on file  . Ran Out of Food in the Last Year: Not on file  Transportation Needs:   . Lack of Transportation (Medical): Not on file    . Lack of Transportation (Non-Medical): Not on file  Physical Activity:   . Days of Exercise per Week: Not on file  . Minutes of Exercise per Session: Not on file  Stress:   . Feeling of Stress : Not on file  Social Connections:   . Frequency of Communication with Friends and Family: Not on file  . Frequency of Social Gatherings with Friends and Family: Not on file  . Attends Religious Services: Not on file  . Active Member of Clubs or Organizations: Not on file  . Attends Archivist Meetings: Not on file  . Marital Status: Not on file  Intimate Partner Violence:   . Fear of Current or Ex-Partner: Not on file  . Emotionally Abused: Not on file  . Physically Abused: Not on file  . Sexually Abused: Not on file   ROS  Review of Systems  Constitution: Negative for weight gain.  Cardiovascular: Negative for dyspnea on exertion, leg swelling and syncope.  Respiratory: Negative for hemoptysis.   Endocrine: Negative for cold intolerance.  Hematologic/Lymphatic: Does not bruise/bleed easily.  Musculoskeletal: Positive for back pain (chronic and on an off).  Gastrointestinal: Negative for hematochezia and melena.  Neurological: Negative for dizziness, headaches and light-headedness.  Psychiatric/Behavioral: Positive for memory loss.   Objective   Vitals with BMI 11/05/2019 09/03/2019 07/23/2019  Height _0  _1  _2   Weight 240 lbs 8 oz 229 lbs 3 oz 232 lbs 11 oz  BMI 31.74 16.07 37.10  Systolic 626 - 948  Diastolic 73 - 66  Pulse 73 - 61    Physical Exam  Constitutional: He appears well-developed and well-nourished. No distress.  HENT:  Head: Atraumatic.  Eyes: Conjunctivae are normal.  Neck: No thyromegaly present.  Cardiovascular: Normal rate, regular rhythm, normal heart sounds and intact distal pulses. Exam reveals no gallop.  No murmur heard. No leg edema, no JVD.  Pulmonary/Chest: Effort normal and breath sounds normal.  Abdominal: Soft. Bowel sounds are  normal.  Musculoskeletal:        General: Normal range of motion.     Cervical back: Neck supple.  Neurological: He is alert.  Psychiatric: He has a normal mood and affect.   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 These have increased in size compared to 2007. - Minimal scattered periventricular and subcortical foci of non-specific gliosis.  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  Laboratory examination:   Recent Labs    06/08/19 1607  NA 136  K 4.0  CL 103  CO2 23  GLUCOSE 131*  BUN 25*  CREATININE 1.24  CALCIUM 8.8*  GFRNONAA >60  GFRAA >60   CMP Latest Ref Rng & Units 06/08/2019  Glucose 70 - 99 mg/dL 131(H)  BUN 8 - 23  mg/dL 25(H)  Creatinine 0.61 - 1.24 mg/dL 1.24  Sodium 135 - 145 mmol/L 136  Potassium 3.5 - 5.1 mmol/L 4.0  Chloride 98 - 111 mmol/L 103  CO2 22 - 32 mmol/L 23  Calcium 8.9 - 10.3 mg/dL 8.8(L)  Total Protein 6.5 - 8.1 g/dL 7.9  Total Bilirubin 0.3 - 1.2 mg/dL 0.9  Alkaline Phos 38 - 126 U/L 86  AST 15 - 41 U/L 21  ALT 0 - 44 U/L 23   CBC Latest Ref Rng & Units 06/08/2019 05/08/2019  WBC 4.0 - 10.5 K/uL 10.2 WILL FOLLOW  Hemoglobin 13.0 - 17.0 g/dL 13.6 WILL FOLLOW  Hematocrit 39.0 - 52.0 % 40.7 WILL FOLLOW  Platelets 150 - 400 K/uL 245 WILL FOLLOW   Lipid Panel     Component Value Date/Time   CHOL 163 10/22/2019 0946   TRIG 152 (H) 10/22/2019 0946   HDL 50 10/22/2019 0946   LDLCALC 87 10/22/2019 0946   LDLDIRECT 110 (H) 08/06/2019 0934    Ref Range & Units 08/06/2019  Lipoprotein (a) <75.0 nmol/L 25.9    HEMOGLOBIN A1C Lab Results  Component Value Date   HGBA1C 5.6 08/06/2019   TSH Recent Labs    05/08/19 1551 08/06/19 0934  TSH 4.630* 4.250   Labs 10/21/2018: A1c 6.2%.  PSA normal.  HB 15.5/HCT 44.2, platelets 256.  Serum glucose 98 mg, BUN 14, creatinine 0.98, eGFR 81 mL, potassium 5.3.  CMP otherwise  normal.    Total cholesterol 291, triglycerides 125, HDL 45, LDL 122.  Non-HDL cholesterol 156.  Medications and allergies  No Known Allergies   Current Outpatient Medications  Medication Instructions  . allopurinol (ZYLOPRIM) 300 mg, Oral, Daily  . benazepril (LOTENSIN) 20 mg, Oral, 2 times daily  . dipyridamole-aspirin (AGGRENOX) 200-25 MG 12hr capsule 1 capsule, Oral, 2 times daily  . levothyroxine (SYNTHROID) 50 mcg, Oral, Daily before breakfast  . MegaRed Omega-3 Krill Oil 500 mg, Oral, Daily at bedtime  . Multiple Vitamins-Minerals (MULTIVITAMINS THER. W/MINERALS) TABS 1 tablet, Daily  . rosuvastatin (CRESTOR) 20 MG tablet TAKE 1 TABLET BY MOUTH EVERY DAY  . tamsulosin (FLOMAX) 0.4 mg, Oral, Daily    Cardiac Studies:   Echocardiogram 07/31/2019: Left ventricle cavity is mildly dilated. Mild concentric hypertrophy of the left ventricle. Normal LV systolic function with EF 55%. Normal global wall motion. Normal diastolic filling pattern.  No significant valvular abnormality. IVC is dilated with blunted respiratory response. Estimated RA pressure 8 mmHg.  Exercise treadmill stress test 08/15/2019: Exercise treadmill stress test performed using Bruce protocol.  Patient reached 10.6 METS, and 106% of age predicted maximum heart rate.  Exercise capacity was excellent.  No chest pain reported.  Normal heart rate and hemodynamic response. Stress EKG revealed no ischemic changes.Occasional PVC's seen on stress EKG. Normal exercise treadmill stress test.  Assessment     ICD-10-CM   1. Postural dizziness  R42   2. Essential hypertension  I10   3. H/O stroke without residual deficits  Z86.73     EKG 07/23/2019: Normal sinus rhythm at the rate of 66 bpm, rightward axis, incomplete right bundle branch block.  Single PVC.  Low-voltage complexes.  Recommendations:   ERMA JOUBERT  is a 64 y.o. 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     With regard to dizziness and mild orthostasis, I had switched him from benazepril HCT to plain  benazepril which he is taking at night.  He has noticed marked improvement in symptoms or dizziness, states he has had one episode of minimal symptoms in last 2 months.    On his last office visit, I had added Crestor after discontinuing lovastatin and niacin.  Lipids are improved.  In view of history of stroke, goal LDL will be <70.  Would recommend addition of Zetia.  He is very motivated in making lifestyle changes, she would like to try to lose weight and recheck in 3 months.  It is certainly an excellent option as well.  He can follow-up with his PCP regarding repeating his labs in 6 months.  Again I reviewed all the data with the patient in the office hyperlipidemia and for secondary prevention of stroke.  I will be happy to see him at any point.  Adrian Prows, MD, [facility] ([facility]) 11/05/2019, 2:16 PM [facility] [place] Pager: [phone] Office: [phone] If no answer Cell [phone]

## 2019-11-18 DIAGNOSIS — M545 Low back pain: Secondary | ICD-10-CM | POA: Diagnosis not present

## 2019-12-11 DIAGNOSIS — M545 Low back pain: Secondary | ICD-10-CM | POA: Diagnosis not present

## 2019-12-19 ENCOUNTER — Other Ambulatory Visit: Payer: Self-pay | Admitting: Cardiology

## 2019-12-19 DIAGNOSIS — E782 Mixed hyperlipidemia: Secondary | ICD-10-CM

## 2019-12-19 DIAGNOSIS — M545 Low back pain: Secondary | ICD-10-CM | POA: Diagnosis not present

## 2019-12-26 DIAGNOSIS — M545 Low back pain: Secondary | ICD-10-CM | POA: Diagnosis not present

## 2020-01-06 DIAGNOSIS — M545 Low back pain: Secondary | ICD-10-CM | POA: Diagnosis not present

## 2020-01-06 DIAGNOSIS — M5416 Radiculopathy, lumbar region: Secondary | ICD-10-CM | POA: Diagnosis not present

## 2020-01-10 ENCOUNTER — Ambulatory Visit: Payer: 59 | Attending: Internal Medicine

## 2020-01-10 DIAGNOSIS — Z23 Encounter for immunization: Secondary | ICD-10-CM

## 2020-01-10 NOTE — Progress Notes (Signed)
   Covid-19 Vaccination Clinic  Name:  Russell Odom    MRN: [id number] DOB: [birth year]  01/10/2020  Mr. [name] was observed post Covid-19 immunization for 15 minutes without incident. He was provided with Vaccine Information Sheet and instruction to access the V-Safe system.   Mr. [name] was instructed to call 911 with any severe reactions post vaccine: Marland Kitchen Difficulty breathing  . Swelling of face and throat  . A fast heartbeat  . A bad rash all over body  . Dizziness and weakness   Immunizations Administered    Name Date Dose VIS Date Route   Moderna COVID-19 Vaccine 01/10/2020  1:22 PM 0.5 mL 09/11/2019 Intramuscular   Manufacturer: Moderna   Lot: [id number]   [id number]

## 2020-01-17 DIAGNOSIS — M545 Low back pain: Secondary | ICD-10-CM | POA: Diagnosis not present

## 2020-02-05 DIAGNOSIS — M5136 Other intervertebral disc degeneration, lumbar region: Secondary | ICD-10-CM | POA: Diagnosis not present

## 2020-02-07 ENCOUNTER — Ambulatory Visit: Payer: 59 | Attending: Internal Medicine

## 2020-02-07 DIAGNOSIS — Z23 Encounter for immunization: Secondary | ICD-10-CM

## 2020-02-07 NOTE — Progress Notes (Signed)
   Covid-19 Vaccination Clinic  Name:  LABARRON PIANKA    MRN: [id number] DOB: 01/07/1956  02/07/2020  Mr. Schur was observed post Covid-19 immunization for 15 minutes without incident. He was provided with Vaccine Information Sheet and instruction to access the V-Safe system.   Mr. Tennies was instructed to call 911 with any severe reactions post vaccine: Marland Kitchen Difficulty breathing  . Swelling of face and throat  . A fast heartbeat  . A bad rash all over body  . Dizziness and weakness   Immunizations Administered    Name Date Dose VIS Date Route   Moderna COVID-19 Vaccine 02/07/2020  1:22 PM 0.5 mL 09/2019 Intramuscular   Manufacturer: Moderna   Lot: [id number]   [id number]

## 2020-02-27 DIAGNOSIS — M5136 Other intervertebral disc degeneration, lumbar region: Secondary | ICD-10-CM | POA: Diagnosis not present

## 2020-02-27 DIAGNOSIS — M545 Low back pain: Secondary | ICD-10-CM | POA: Diagnosis not present

## 2020-03-08 DIAGNOSIS — M5416 Radiculopathy, lumbar region: Secondary | ICD-10-CM | POA: Diagnosis not present

## 2020-03-15 ENCOUNTER — Other Ambulatory Visit: Payer: Self-pay | Admitting: Cardiology

## 2020-03-15 DIAGNOSIS — E782 Mixed hyperlipidemia: Secondary | ICD-10-CM

## 2020-03-18 DIAGNOSIS — M21372 Foot drop, left foot: Secondary | ICD-10-CM | POA: Diagnosis not present

## 2020-03-18 DIAGNOSIS — M545 Low back pain: Secondary | ICD-10-CM | POA: Diagnosis not present

## 2020-03-18 DIAGNOSIS — M5416 Radiculopathy, lumbar region: Secondary | ICD-10-CM | POA: Diagnosis not present

## 2020-03-21 ENCOUNTER — Ambulatory Visit: Payer: Self-pay | Admitting: Orthopedic Surgery

## 2020-03-24 DIAGNOSIS — M5136 Other intervertebral disc degeneration, lumbar region: Secondary | ICD-10-CM | POA: Diagnosis not present

## 2020-03-24 DIAGNOSIS — Z7901 Long term (current) use of anticoagulants: Secondary | ICD-10-CM | POA: Diagnosis not present

## 2020-03-24 DIAGNOSIS — I639 Cerebral infarction, unspecified: Secondary | ICD-10-CM | POA: Diagnosis not present

## 2020-03-28 DIAGNOSIS — M5416 Radiculopathy, lumbar region: Secondary | ICD-10-CM | POA: Diagnosis not present

## 2020-04-04 NOTE — Progress Notes (Signed)
[id number] - [place], [place] [address] [place] [address] Phone: [phone] Fax: [phone]      Your procedure is scheduled on Wednesday, June 30th.  Report to [facility] Main Entrance "A" at 5:30 A.M., and check in at the Admitting office.  Call this number if you have problems the morning of surgery:  [phone]  Call [phone] if you have any questions prior to your surgery date Monday-Friday 8am-4pm    Remember:  Do not eat or drink after midnight the night before your surgery    Take these medicines the morning of surgery with A SIP OF WATER   Allopurinol (Zyloprim)  Gabapentin (Neurontin)  Levothyroxine (Synthroid)  Follow your surgeon's instructions on when to stop Dipyridamole-Aspirin (Aggrenox).  If no instructions were given by your surgeon then you will need to call the office to get those instructions.      As of today, STOP taking any Aspirin containing products, Aleve, Naproxen, Ibuprofen, Motrin, Advil, Goody's, BC's, all herbal medications, fish oil, and all vitamins.                      Do not wear jewelry            Do not wear lotions, powders, colognes, or deodorant.            Men may shave face and neck.            Do not bring valuables to the hospital.            [facility] is not responsible for any belongings or valuables.  Do NOT Smoke (Tobacco/Vapping) or drink Alcohol 24 hours prior to your procedure If you use a CPAP at night, you may bring all equipment for your overnight stay.   Contacts, glasses, dentures or bridgework may not be worn into surgery.      For patients admitted to the hospital, discharge time will be determined by your treatment team.   Patients discharged the day of surgery will not be allowed to drive home, and someone needs to stay with them for 24 hours.    Special instructions:   [place]- Preparing For Surgery  Before surgery, you can play an important  role. Because skin is not sterile, your skin needs to be as free of germs as possible. You can reduce the number of germs on your skin by washing with CHG (chlorahexidine gluconate) Soap before surgery.  CHG is an antiseptic cleaner which kills germs and bonds with the skin to continue killing germs even after washing.    Oral Hygiene is also important to reduce your risk of infection.  Remember - BRUSH YOUR TEETH THE MORNING OF SURGERY WITH YOUR REGULAR TOOTHPASTE  Please do not use if you have an allergy to CHG or antibacterial soaps. If your skin becomes reddened/irritated stop using the CHG.  Do not shave (including legs and underarms) for at least 48 hours prior to first CHG shower. It is OK to shave your face.  Please follow these instructions carefully.   1. Shower the NIGHT BEFORE SURGERY and the MORNING OF SURGERY with CHG Soap.   2. If you chose to wash your hair, wash your hair first as usual with your normal shampoo.  3. After you shampoo, rinse your hair and body thoroughly to remove the shampoo.  4. Use CHG as you would any other liquid soap. You can apply CHG directly  to the skin and wash gently with a scrungie or a clean washcloth.   5. Apply the CHG Soap to your body ONLY FROM THE NECK DOWN.  Do not use on open wounds or open sores. Avoid contact with your eyes, ears, mouth and genitals (private parts). Wash Face and genitals (private parts)  with your normal soap.   6. Wash thoroughly, paying special attention to the area where your surgery will be performed.  7. Thoroughly rinse your body with warm water from the neck down.  8. DO NOT shower/wash with your normal soap after using and rinsing off the CHG Soap.  9. Pat yourself dry with a CLEAN TOWEL.  10. Wear CLEAN PAJAMAS to bed the night before surgery, wear comfortable clothes the morning of surgery  11. Place CLEAN SHEETS on your bed the night of your first shower and DO NOT SLEEP WITH PETS.   Day of Surgery:    Do not apply any deodorants/lotions.  Please wear clean clothes to the hospital/surgery center.   Remember to brush your teeth WITH YOUR REGULAR TOOTHPASTE.   Please read over the following fact sheets that you were given.

## 2020-04-07 ENCOUNTER — Encounter (HOSPITAL_COMMUNITY)
Admission: RE | Admit: 2020-04-07 | Discharge: 2020-04-07 | Disposition: A | Discharge status: Home / Self Care | Payer: PPO | Source: Ambulatory Visit | Attending: Orthopedic Surgery | Admitting: Orthopedic Surgery

## 2020-04-07 ENCOUNTER — Other Ambulatory Visit (HOSPITAL_COMMUNITY)
Admission: RE | Admit: 2020-04-07 | Discharge: 2020-04-07 | Disposition: A | Discharge status: Home / Self Care | Payer: PPO | Source: Ambulatory Visit | Attending: Orthopedic Surgery | Admitting: Orthopedic Surgery

## 2020-04-07 ENCOUNTER — Encounter (HOSPITAL_COMMUNITY): Payer: Self-pay

## 2020-04-07 ENCOUNTER — Ambulatory Visit (HOSPITAL_COMMUNITY): Admission: RE | Admit: 2020-04-07 | Payer: PPO | Source: Ambulatory Visit

## 2020-04-07 ENCOUNTER — Other Ambulatory Visit: Payer: Self-pay

## 2020-04-07 DIAGNOSIS — Z01812 Encounter for preprocedural laboratory examination: Secondary | ICD-10-CM | POA: Diagnosis not present

## 2020-04-07 DIAGNOSIS — Z01818 Encounter for other preprocedural examination: Secondary | ICD-10-CM

## 2020-04-07 DIAGNOSIS — Z20822 Contact with and (suspected) exposure to covid-19: Secondary | ICD-10-CM | POA: Diagnosis not present

## 2020-04-07 LAB — BASIC METABOLIC PANEL
Anion gap: 10 (ref 5–15)
BUN: 15 mg/dL (ref 8–23)
CO2: 24 mmol/L (ref 22–32)
Calcium: 9.7 mg/dL (ref 8.9–10.3)
Chloride: 106 mmol/L (ref 98–111)
Creatinine, Ser: 1.08 mg/dL (ref 0.61–1.24)
GFR calc Af Amer: >60 mL/min (ref >60)
GFR calc non Af Amer: >60 mL/min (ref >60)
Glucose, Bld: 114 mg/dL — ABNORMAL HIGH (ref 70–99)
Potassium: 4.6 mmol/L (ref 3.5–5.1)
Sodium: 140 mmol/L (ref 135–145)

## 2020-04-07 LAB — CBC
HCT: 45.4 % (ref 39.0–52.0)
Hemoglobin: 15.1 g/dL (ref 13.0–17.0)
MCH: 31.8 pg (ref 26.0–34.0)
MCHC: 33.3 g/dL (ref 30.0–36.0)
MCV: 95.6 fL (ref 80.0–100.0)
Platelets: 217 10*3/uL (ref 150–400)
RBC: 4.75 MIL/uL (ref 4.22–5.81)
RDW: 13.1 % (ref 11.5–15.5)
WBC: 6.8 10*3/uL (ref 4.0–10.5)
nRBC: 0 % (ref 0.0–0.2)

## 2020-04-07 LAB — URINALYSIS, ROUTINE W REFLEX MICROSCOPIC
Bacteria, UA: NONE SEEN
Bilirubin Urine: NEGATIVE
Glucose, UA: NEGATIVE mg/dL
Ketones, ur: NEGATIVE mg/dL
Leukocytes,Ua: NEGATIVE
Nitrite: NEGATIVE
Protein, ur: NEGATIVE mg/dL
Specific Gravity, Urine: 1.019 (ref 1.005–1.030)
pH: 5 (ref 5.0–8.0)

## 2020-04-07 LAB — SURGICAL PCR SCREEN
MRSA, PCR: NEGATIVE
Staphylococcus aureus: NEGATIVE

## 2020-04-07 LAB — SARS CORONAVIRUS 2 (TAT 6-24 HRS): SARS Coronavirus 2: NEGATIVE

## 2020-04-07 LAB — APTT: aPTT: 28 seconds (ref 24–36)

## 2020-04-07 LAB — PROTIME-INR
INR: 1 (ref 0.8–1.2)
Prothrombin Time: 12.5 seconds (ref 11.4–15.2)

## 2020-04-07 NOTE — Progress Notes (Addendum)
PCP - Sofie Rower will be transfering to [place] soon Cardiologist - Adrian Prows  Chest x-ray - DOS patient left before getting xray EKG - 07/23/19 Stress Test - 08/15/19 ECHO - 07/31/19 Cardiac Cath - denies  Aspirin Instructions: stopped 04/02/20  COVID TEST- 04/07/20   Anesthesia review: yes, hx cva  Patient denies shortness of breath, fever, cough and chest pain at PAT appointment   All instructions explained to the patient, with a verbal understanding of the material. Patient agrees to go over the instructions while at home for a better understanding. Patient also instructed to self quarantine after being tested for COVID-19. The opportunity to ask questions was provided.

## 2020-04-07 NOTE — Progress Notes (Signed)
Anesthesia Chart Review:  Previously followed by cardiology Dr. Einar Gip for hx of CVA 2007 and for risk factor modification. Recent treadmill test and TTE were both normal. Last seen 11/05/19, doing well at that time, recommended continue with PCP for risk factor management and followup with cardiology as needed.  Preop labs reviewed, unremarkable.  EKG 07/23/2019: NSR. Rate 66. Rightward axis. IRBBB.  Single PVC.  Low-voltage complexes.  Echocardiogram 07/31/2019: Left ventricle cavity is mildly dilated. Mild concentric hypertrophy of the left ventricle. Normal LV systolic function with EF 55%. Normal global wall motion. Normal diastolic filling pattern.  No significant valvular abnormality. IVC is dilated with blunted respiratory response. Estimated RA pressure 8 mmHg.  Exercise treadmill stress test 08/15/2019: Exercise treadmill stress test performed using Bruce protocol. Patient reached 10.6 METS, and 106% of age predicted maximum heart rate. Exercise capacity was excellent. No chest pain reported. Normal heart rate and hemodynamic response. Stress EKG revealed no ischemic changes.Occasional PVC's seen on stress EKG. Normal exercise treadmill stress test.  Karoline Caldwell, PA-C [facility]/Anesthesiology Phone [phone] 04/08/2020 8:33 AM

## 2020-04-08 NOTE — Anesthesia Preprocedure Evaluation (Addendum)
Anesthesia Evaluation  Patient identified by MRN, date of birth, ID band Patient awake    Reviewed: Allergy & Precautions, H&P , NPO status , Patient's Chart, lab work & pertinent test results  Airway Mallampati: II  TM Distance: >3 FB Neck ROM: Full    Dental no notable dental hx. (+) Teeth Intact, Dental Advisory Given   Pulmonary neg pulmonary ROS,    Pulmonary exam normal breath sounds clear to auscultation       Cardiovascular Exercise Tolerance: Good hypertension, Pt. on medications negative cardio ROS   Rhythm:Regular Rate:Normal     Neuro/Psych CVA, Residual Symptoms negative neurological ROS  negative psych ROS   GI/Hepatic negative GI ROS, Neg liver ROS,   Endo/Other  negative endocrine ROSHypothyroidism   Renal/GU negative Renal ROS  negative genitourinary   Musculoskeletal  (+) Arthritis , Osteoarthritis,    Abdominal   Peds  Hematology negative hematology ROS (+)   Anesthesia Other Findings   Reproductive/Obstetrics negative OB ROS                           Anesthesia Physical Anesthesia Plan  ASA: II  Anesthesia Plan: General   Post-op Pain Management:    Induction: Intravenous  PONV Risk Score and Plan: 3 and Ondansetron, Dexamethasone and Midazolam  Airway Management Planned: Oral ETT  Additional Equipment:   Intra-op Plan:   Post-operative Plan: Extubation in OR  Informed Consent: I have reviewed the patients History and Physical, chart, labs and discussed the procedure including the risks, benefits and alternatives for the proposed anesthesia with the patient or authorized representative who has indicated his/her understanding and acceptance.     Dental advisory given  Plan Discussed with: CRNA  Anesthesia Plan Comments: (PAT note by Karoline Caldwell, PA-C: Previously followed by cardiology Dr. Einar Gip for hx of CVA 2007 and for risk factor modification.  Recent treadmill test and TTE were both normal. Last seen 11/05/19, doing well at that time, recommended continue with PCP for risk factor management and followup with cardiology as needed.  Preop labs reviewed, unremarkable.  EKG 07/23/2019: NSR. Rate 66. Rightward axis. IRBBB.  Single PVC.  Low-voltage complexes.  Echocardiogram 07/31/2019: Left ventricle cavity is mildly dilated. Mild concentric hypertrophy of the left ventricle. Normal LV systolic function with EF 55%. Normal global wall motion. Normal diastolic filling pattern.  No significant valvular abnormality. IVC is dilated with blunted respiratory response. Estimated RA pressure 8 mmHg.  Exercise treadmill stress test 08/15/2019: Exercise treadmill stress test performed using Bruce protocol. Patient reached 10.6 METS, and 106% of age predicted maximum heart rate. Exercise capacity was excellent. No chest pain reported. Normal heart rate and hemodynamic response. Stress EKG revealed no ischemic changes.Occasional PVC's seen on stress EKG. Normal exercise treadmill stress test. )       Anesthesia Quick Evaluation

## 2020-04-09 ENCOUNTER — Encounter (HOSPITAL_COMMUNITY): Payer: Self-pay | Admitting: Orthopedic Surgery

## 2020-04-09 ENCOUNTER — Ambulatory Visit (HOSPITAL_COMMUNITY): Payer: PPO

## 2020-04-09 ENCOUNTER — Ambulatory Visit (HOSPITAL_COMMUNITY)
Admission: RE | Admit: 2020-04-09 | Discharge: 2020-04-09 | Disposition: A | Discharge status: Home / Self Care | Payer: PPO | Attending: Orthopedic Surgery | Admitting: Orthopedic Surgery

## 2020-04-09 ENCOUNTER — Ambulatory Visit (HOSPITAL_COMMUNITY): Payer: PPO | Admitting: Anesthesiology

## 2020-04-09 ENCOUNTER — Ambulatory Visit (HOSPITAL_COMMUNITY): Payer: PPO | Admitting: Physician Assistant

## 2020-04-09 ENCOUNTER — Ambulatory Visit (HOSPITAL_COMMUNITY)
Admission: RE | Disposition: A | Discharge status: Home / Self Care | Payer: Self-pay | Source: Home / Self Care | Attending: Orthopedic Surgery

## 2020-04-09 ENCOUNTER — Other Ambulatory Visit: Payer: Self-pay

## 2020-04-09 DIAGNOSIS — Z419 Encounter for procedure for purposes other than remedying health state, unspecified: Secondary | ICD-10-CM

## 2020-04-09 DIAGNOSIS — E782 Mixed hyperlipidemia: Secondary | ICD-10-CM | POA: Diagnosis not present

## 2020-04-09 DIAGNOSIS — E7800 Pure hypercholesterolemia, unspecified: Secondary | ICD-10-CM | POA: Diagnosis not present

## 2020-04-09 DIAGNOSIS — Z79890 Hormone replacement therapy (postmenopausal): Secondary | ICD-10-CM | POA: Diagnosis not present

## 2020-04-09 DIAGNOSIS — I10 Essential (primary) hypertension: Secondary | ICD-10-CM | POA: Diagnosis not present

## 2020-04-09 DIAGNOSIS — E039 Hypothyroidism, unspecified: Secondary | ICD-10-CM | POA: Diagnosis not present

## 2020-04-09 DIAGNOSIS — Z01818 Encounter for other preprocedural examination: Secondary | ICD-10-CM | POA: Diagnosis not present

## 2020-04-09 DIAGNOSIS — Z79899 Other long term (current) drug therapy: Secondary | ICD-10-CM | POA: Diagnosis not present

## 2020-04-09 DIAGNOSIS — Z7982 Long term (current) use of aspirin: Secondary | ICD-10-CM | POA: Diagnosis not present

## 2020-04-09 DIAGNOSIS — Z79891 Long term (current) use of opiate analgesic: Secondary | ICD-10-CM | POA: Insufficient documentation

## 2020-04-09 DIAGNOSIS — M5116 Intervertebral disc disorders with radiculopathy, lumbar region: Secondary | ICD-10-CM | POA: Insufficient documentation

## 2020-04-09 DIAGNOSIS — Z8673 Personal history of transient ischemic attack (TIA), and cerebral infarction without residual deficits: Secondary | ICD-10-CM | POA: Insufficient documentation

## 2020-04-09 DIAGNOSIS — Z981 Arthrodesis status: Secondary | ICD-10-CM | POA: Diagnosis not present

## 2020-04-09 HISTORY — PX: LUMBAR LAMINECTOMY/DECOMPRESSION MICRODISCECTOMY: SHX5026

## 2020-04-09 SURGERY — LUMBAR LAMINECTOMY/DECOMPRESSION MICRODISCECTOMY 1 LEVEL
Anesthesia: General | Laterality: Left

## 2020-04-09 MED ORDER — THROMBIN (RECOMBINANT) 20000 UNITS EX SOLR
CUTANEOUS | Status: AC
  Filled 2020-04-09: qty 20000

## 2020-04-09 MED ORDER — MIDAZOLAM HCL 2 MG/2ML IJ SOLN
INTRAMUSCULAR | Status: DC | PRN
  Administered 2020-04-09: 2 mg via INTRAVENOUS

## 2020-04-09 MED ORDER — BUPIVACAINE HCL (PF) 0.25 % IJ SOLN
INTRAMUSCULAR | Status: DC | PRN
  Administered 2020-04-09: 20 mL

## 2020-04-09 MED ORDER — CHLORHEXIDINE GLUCONATE 0.12 % MT SOLN
15.0000 mL | Freq: Once | OROMUCOSAL | Status: AC
  Administered 2020-04-09: 15 mL via OROMUCOSAL
  Filled 2020-04-09: qty 15

## 2020-04-09 MED ORDER — BUPIVACAINE HCL (PF) 0.25 % IJ SOLN
INTRAMUSCULAR | Status: AC
  Filled 2020-04-09: qty 30

## 2020-04-09 MED ORDER — PHENYLEPHRINE HCL-NACL 10-0.9 MG/250ML-% IV SOLN
INTRAVENOUS | Status: DC | PRN
Start: 2020-04-09 — End: 2020-04-09
  Administered 2020-04-09: 30 ug/min via INTRAVENOUS

## 2020-04-09 MED ORDER — ROCURONIUM BROMIDE 10 MG/ML (PF) SYRINGE
PREFILLED_SYRINGE | INTRAVENOUS | Status: AC
  Filled 2020-04-09: qty 10

## 2020-04-09 MED ORDER — OXYCODONE-ACETAMINOPHEN 10-325 MG PO TABS: 1.0000 | ORAL_TABLET | Freq: Four times a day (QID) | ORAL | 0 refills | Status: AC | PRN

## 2020-04-09 MED ORDER — PROPOFOL 10 MG/ML IV BOLUS
INTRAVENOUS | Status: AC
  Filled 2020-04-09: qty 20

## 2020-04-09 MED ORDER — EPINEPHRINE PF 1 MG/ML IJ SOLN
INTRAMUSCULAR | Status: AC
  Filled 2020-04-09: qty 1

## 2020-04-09 MED ORDER — ACETAMINOPHEN 10 MG/ML IV SOLN
INTRAVENOUS | Status: AC
  Filled 2020-04-09: qty 100

## 2020-04-09 MED ORDER — METHOCARBAMOL 500 MG PO TABS: 500.0000 mg | ORAL_TABLET | Freq: Three times a day (TID) | ORAL | 0 refills | Status: AC | PRN

## 2020-04-09 MED ORDER — 0.9 % SODIUM CHLORIDE (POUR BTL) OPTIME
TOPICAL | Status: DC | PRN
  Administered 2020-04-09: 1000 mL

## 2020-04-09 MED ORDER — METHYLPREDNISOLONE ACETATE 40 MG/ML INJ SUSP (RADIOLOG
INTRAMUSCULAR | Status: DC | PRN
  Administered 2020-04-09: 40 mg

## 2020-04-09 MED ORDER — ONDANSETRON HCL 4 MG PO TABS: 4.0000 mg | ORAL_TABLET | Freq: Three times a day (TID) | ORAL | 0 refills | Status: DC | PRN

## 2020-04-09 MED ORDER — ACETAMINOPHEN 10 MG/ML IV SOLN
INTRAVENOUS | Status: DC | PRN
Start: 2020-04-09 — End: 2020-04-09
  Administered 2020-04-09: 1000 mg via INTRAVENOUS

## 2020-04-09 MED ORDER — LIDOCAINE 2% (20 MG/ML) 5 ML SYRINGE
INTRAMUSCULAR | Status: AC
  Filled 2020-04-09: qty 5

## 2020-04-09 MED ORDER — ROCURONIUM BROMIDE 10 MG/ML (PF) SYRINGE
PREFILLED_SYRINGE | INTRAVENOUS | Status: DC | PRN
  Administered 2020-04-09: 60 mg via INTRAVENOUS

## 2020-04-09 MED ORDER — METHYLPREDNISOLONE ACETATE 40 MG/ML IJ SUSP
INTRAMUSCULAR | Status: AC
  Filled 2020-04-09: qty 1

## 2020-04-09 MED ORDER — EPINEPHRINE PF 1 MG/ML IJ SOLN
INTRAMUSCULAR | Status: DC | PRN
  Administered 2020-04-09: .15 mL

## 2020-04-09 MED ORDER — ONDANSETRON HCL 4 MG/2ML IJ SOLN
INTRAMUSCULAR | Status: AC
  Filled 2020-04-09: qty 2

## 2020-04-09 MED ORDER — LIDOCAINE 2% (20 MG/ML) 5 ML SYRINGE
INTRAMUSCULAR | Status: DC | PRN
  Administered 2020-04-09: 60 mg via INTRAVENOUS

## 2020-04-09 MED ORDER — DEXAMETHASONE SODIUM PHOSPHATE 10 MG/ML IJ SOLN
INTRAMUSCULAR | Status: DC | PRN
  Administered 2020-04-09: 4 mg via INTRAVENOUS

## 2020-04-09 MED ORDER — HEMOSTATIC AGENTS (NO CHARGE) OPTIME
TOPICAL | Status: DC | PRN
  Administered 2020-04-09: 1 via TOPICAL

## 2020-04-09 MED ORDER — LACTATED RINGERS IV SOLN: INTRAVENOUS | Status: DC | PRN

## 2020-04-09 MED ORDER — DEXAMETHASONE SODIUM PHOSPHATE 10 MG/ML IJ SOLN
INTRAMUSCULAR | Status: AC
  Filled 2020-04-09: qty 1

## 2020-04-09 MED ORDER — MIDAZOLAM HCL 2 MG/2ML IJ SOLN
INTRAMUSCULAR | Status: AC
  Filled 2020-04-09: qty 2

## 2020-04-09 MED ORDER — ORAL CARE MOUTH RINSE: 15.0000 mL | Freq: Once | OROMUCOSAL | Status: AC

## 2020-04-09 MED ORDER — PROPOFOL 10 MG/ML IV BOLUS
INTRAVENOUS | Status: DC | PRN
  Administered 2020-04-09: 120 mg via INTRAVENOUS

## 2020-04-09 MED ORDER — FENTANYL CITRATE (PF) 250 MCG/5ML IJ SOLN
INTRAMUSCULAR | Status: AC
  Filled 2020-04-09: qty 5

## 2020-04-09 MED ORDER — GLYCOPYRROLATE PF 0.2 MG/ML IJ SOSY
PREFILLED_SYRINGE | INTRAMUSCULAR | Status: AC
  Filled 2020-04-09: qty 1

## 2020-04-09 MED ORDER — ONDANSETRON HCL 4 MG/2ML IJ SOLN
INTRAMUSCULAR | Status: DC | PRN
  Administered 2020-04-09: 4 mg via INTRAVENOUS

## 2020-04-09 MED ORDER — EPHEDRINE 5 MG/ML INJ
INTRAVENOUS | Status: AC
  Filled 2020-04-09: qty 10

## 2020-04-09 MED ORDER — LACTATED RINGERS IV SOLN: INTRAVENOUS | Status: DC

## 2020-04-09 MED ORDER — SUGAMMADEX SODIUM 200 MG/2ML IV SOLN
INTRAVENOUS | Status: DC | PRN
  Administered 2020-04-09: 200 mg via INTRAVENOUS

## 2020-04-09 MED ORDER — FENTANYL CITRATE (PF) 250 MCG/5ML IJ SOLN
INTRAMUSCULAR | Status: DC | PRN
  Administered 2020-04-09: 100 ug via INTRAVENOUS

## 2020-04-09 MED ORDER — CEFAZOLIN SODIUM-DEXTROSE 2-4 GM/100ML-% IV SOLN
2.0000 g | INTRAVENOUS | Status: AC
  Administered 2020-04-09: 2 g via INTRAVENOUS
  Filled 2020-04-09: qty 100

## 2020-04-09 MED ORDER — THROMBIN 20000 UNITS EX SOLR
CUTANEOUS | Status: DC | PRN
  Administered 2020-04-09: 20 mL

## 2020-04-09 MED ORDER — HYDROMORPHONE HCL 1 MG/ML IJ SOLN: 0.2500 mg | INTRAMUSCULAR | Status: DC | PRN

## 2020-04-09 MED ORDER — EPINEPHRINE 1 MG/10ML IJ SOSY
PREFILLED_SYRINGE | INTRAMUSCULAR | Status: AC
  Filled 2020-04-09: qty 10

## 2020-04-09 MED ORDER — EPHEDRINE SULFATE-NACL 50-0.9 MG/10ML-% IV SOSY
PREFILLED_SYRINGE | INTRAVENOUS | Status: DC | PRN
  Administered 2020-04-09: 5 mg via INTRAVENOUS
  Administered 2020-04-09 (×2): 10 mg via INTRAVENOUS

## 2020-04-09 SURGICAL SUPPLY — 56 items
BNDG GAUZE ELAST 4 BULKY (GAUZE/BANDAGES/DRESSINGS) ×3 IMPLANT
BUR MATCHSTICK NEURO 3.0 LAGG (BURR) IMPLANT
CANISTER SUCT 3000ML PPV (MISCELLANEOUS) ×3 IMPLANT
CLOSURE STERI-STRIP 1/2X4 (GAUZE/BANDAGES/DRESSINGS) ×1
CLSR STERI-STRIP ANTIMIC 1/2X4 (GAUZE/BANDAGES/DRESSINGS) ×2 IMPLANT
COVER SURGICAL LIGHT HANDLE (MISCELLANEOUS) ×3 IMPLANT
COVER WAND RF STERILE (DRAPES) ×3 IMPLANT
DRAIN CHANNEL 15F RND FF W/TCR (WOUND CARE) IMPLANT
DRAPE POUCH INSTRU U-SHP 10X18 (DRAPES) ×3 IMPLANT
DRAPE SURG 17X23 STRL (DRAPES) ×3 IMPLANT
DRAPE U-SHAPE 47X51 STRL (DRAPES) ×3 IMPLANT
DRSG OPSITE POSTOP 3X4 (GAUZE/BANDAGES/DRESSINGS) ×3 IMPLANT
DRSG OPSITE POSTOP 4X6 (GAUZE/BANDAGES/DRESSINGS) ×3 IMPLANT
DURAPREP 26ML APPLICATOR (WOUND CARE) ×3 IMPLANT
ELECT BLADE 4.0 EZ CLEAN MEGAD (MISCELLANEOUS)
ELECT PENCIL ROCKER SW 15FT (MISCELLANEOUS) ×3 IMPLANT
ELECT REM PT RETURN 9FT ADLT (ELECTROSURGICAL) ×3
ELECTRODE BLDE 4.0 EZ CLN MEGD (MISCELLANEOUS) IMPLANT
ELECTRODE REM PT RTRN 9FT ADLT (ELECTROSURGICAL) ×1 IMPLANT
EVACUATOR SILICONE 100CC (DRAIN) IMPLANT
GLOVE BIO SURGEON STRL SZ 6.5 (GLOVE) ×2 IMPLANT
GLOVE BIO SURGEONS STRL SZ 6.5 (GLOVE) ×1
GLOVE BIOGEL PI IND STRL 6.5 (GLOVE) ×1 IMPLANT
GLOVE BIOGEL PI IND STRL 8.5 (GLOVE) ×1 IMPLANT
GLOVE BIOGEL PI INDICATOR 6.5 (GLOVE) ×2
GLOVE BIOGEL PI INDICATOR 8.5 (GLOVE) ×2
GLOVE SS BIOGEL STRL SZ 8.5 (GLOVE) ×1 IMPLANT
GLOVE SUPERSENSE BIOGEL SZ 8.5 (GLOVE) ×2
GOWN STRL REUS W/ TWL LRG LVL3 (GOWN DISPOSABLE) ×2 IMPLANT
GOWN STRL REUS W/TWL 2XL LVL3 (GOWN DISPOSABLE) ×3 IMPLANT
GOWN STRL REUS W/TWL LRG LVL3 (GOWN DISPOSABLE) ×6
KIT BASIN OR (CUSTOM PROCEDURE TRAY) ×3 IMPLANT
KIT TURNOVER KIT B (KITS) ×3 IMPLANT
NEEDLE 22X1 1/2 (OR ONLY) (NEEDLE) ×3 IMPLANT
NEEDLE SPNL 18GX3.5 QUINCKE PK (NEEDLE) ×6 IMPLANT
NS IRRIG 1000ML POUR BTL (IV SOLUTION) ×3 IMPLANT
PACK LAMINECTOMY ORTHO (CUSTOM PROCEDURE TRAY) ×3 IMPLANT
PACK UNIVERSAL I (CUSTOM PROCEDURE TRAY) ×3 IMPLANT
PAD ARMBOARD 7.5X6 YLW CONV (MISCELLANEOUS) ×6 IMPLANT
PATTIES SURGICAL .5 X.5 (GAUZE/BANDAGES/DRESSINGS) ×3 IMPLANT
PATTIES SURGICAL .5 X1 (DISPOSABLE) ×3 IMPLANT
SPONGE SURGIFOAM ABS GEL 100 (HEMOSTASIS) ×3 IMPLANT
SURGIFLO W/THROMBIN 8M KIT (HEMOSTASIS) ×3 IMPLANT
SUT BONE WAX W31G (SUTURE) ×3 IMPLANT
SUT MNCRL AB 3-0 PS2 27 (SUTURE) ×3 IMPLANT
SUT VIC AB 0 CT1 27 (SUTURE)
SUT VIC AB 0 CT1 27XBRD ANBCTR (SUTURE) IMPLANT
SUT VIC AB 1 CT1 18XCR BRD 8 (SUTURE) ×1 IMPLANT
SUT VIC AB 1 CT1 8-18 (SUTURE) ×3
SUT VIC AB 2-0 CT1 18 (SUTURE) ×3 IMPLANT
SYR BULB IRRIG 60ML STRL (SYRINGE) ×3 IMPLANT
SYR CONTROL 10ML LL (SYRINGE) ×3 IMPLANT
TOWEL GREEN STERILE (TOWEL DISPOSABLE) ×3 IMPLANT
TOWEL GREEN STERILE FF (TOWEL DISPOSABLE) ×3 IMPLANT
WATER STERILE IRR 1000ML POUR (IV SOLUTION) ×3 IMPLANT
YANKAUER SUCT BULB TIP NO VENT (SUCTIONS) ×3 IMPLANT

## 2020-04-09 NOTE — Progress Notes (Signed)
Patient ambulated the length of PACU, tolerated it well, no complaints of pain, numbness or tingling. Discharge instructions reviewed with patient and wife. Discharge packet along with prescriptions given to wife.  Rowe Pavy, RN

## 2020-04-09 NOTE — Anesthesia Postprocedure Evaluation (Signed)
Anesthesia Post Note  Patient: Russell Odom  Procedure(s) Performed: Lumbar four through lumbar five Discectomy left (Left )     Patient location during evaluation: PACU Anesthesia Type: General Level of consciousness: awake and alert Pain management: pain level controlled Vital Signs Assessment: post-procedure vital signs reviewed and stable Respiratory status: spontaneous breathing, nonlabored ventilation and respiratory function stable Cardiovascular status: blood pressure returned to baseline and stable Postop Assessment: no apparent nausea or vomiting Anesthetic complications: no   No complications documented.  Last Vitals:  Vitals:   04/09/20 1000 04/09/20 1015  BP: 138/88 (!) 148/84  Pulse: 72 69  Resp: 16 15  Temp:  36.6 C  SpO2: 94% 97%    Last Pain:  Vitals:   04/09/20 1015  TempSrc:   PainSc: 0-No pain                 Erisha Paugh,W. EDMOND

## 2020-04-09 NOTE — Transfer of Care (Signed)
Immediate Anesthesia Transfer of Care Note  Patient: TROOPER OLANDER  Procedure(s) Performed: Lumbar four through lumbar five Discectomy left (Left )  Patient Location: PACU  Anesthesia Type:General  Level of Consciousness: drowsy, patient cooperative and responds to stimulation  Airway & Oxygen Therapy: Patient Spontanous Breathing and Patient connected to face mask oxygen  Post-op Assessment: Report given to RN and Post -op Vital signs reviewed and stable  Post vital signs: Reviewed and stable  Last Vitals:  Vitals Value Taken Time  BP 139/75 04/09/20 0948  Temp 36.6 C 04/09/20 0945  Pulse 74 04/09/20 0949  Resp 17 04/09/20 0949  SpO2 100 % 04/09/20 0949  Vitals shown include unvalidated device data.  Last Pain:  Vitals:   04/09/20 0945  TempSrc:   PainSc: 0-No pain      Patients Stated Pain Goal: 2 (21/11/55 2080)  Complications: No complications documented.

## 2020-04-09 NOTE — Brief Op Note (Signed)
04/09/2020  9:28 AM  PATIENT:  Russell Odom  64 y.o. 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

## 2020-04-09 NOTE — Op Note (Signed)
Operative report  Preoperative diagnosis: Left L4-5 disc herniation with L5 radiculopathy  Postoperative diagnosis: Same  Operative report: Left L4-5 hemilaminotomy and medial facetectomy for discectomy.  CPT code: [phone]  First Assistant: Cleta Alberts, PA  Complications: None  EBL: 50 cc  Indications: Patient is a very pleasant young man with longstanding low back pain who presents with acute onset of severe radicular left leg pain, foot drop, and positive nerve root tension signs in the left lower extremity.  MRI demonstrated a large posterior lateral to the left disc herniation at L4-5 with compression of the traversing L5 nerve root.  As result of the progressive neurological deficits and the severe radicular leg pain we elected to move forward with surgery.  All appropriate risks benefits and alternatives were discussed with the patient and consent was obtained.  Operative report:  Patient is brought the operating room placed upon the operating room table.  After successful induction of general anesthesia and endotracheal intubation, teds SCDs were applied and the patient was turned prone onto the Wilson frame.  All bony prominences well-padded and the back was prepped and draped in a standard fashion.  A timeout was taken to confirm patient procedure and all other important data.  2 needles were placed in the back and an x-ray was taken for localization of the skin incision.  I marked out the skin incision and then inserted 10 cc of quarter percent Marcaine with epinephrine.  Midline incision was made and sharp dissection was carried out down to the deep fascia.  The deep fascia was sharply incised and then I injected the left paraspinal musculature with the remaining portion of quarter percent Marcaine with epinephrine.  I then stripped the paraspinal muscles to expose the L4 and L5 spinous process and the L4-5 facet complex.  A Penfield 4 was placed under the L4 lamina and an x-ray was  taken to confirm I was at the appropriate level.  Taylor retractor was placed on the lateral aspect of the facet complex and held in place for visualization.  Laminotomy was performed on the left side of L3 with the 3 and 4 mm Kerrison rongeur.  Bone wax was applied to the exposed bone in order to aid in hemostasis.  Penfield 4 was then used to dissect through the ligamentum flavum until I could develop a plane between the thecal sac and the ligamentum flavum.  Once I had this clear plane exposed I used my 2 and 3 mm Kerrison rongeur to remove the ligamentum flavum and expose the dorsal surface of the thecal sac.  Immediately the L5 nerve root came into view and was significantly displaced and under compression.  I then gently continued my dissection with a Penfield 4 into the lateral recess and used my 2 mm Kerrison rongeur to perform a medial facetectomy to better visualize the disc fragment.  With this lateral exposure I did not need to further retract the nerve root which is already under significant compression.  I was able to use my nerve hook to gently dissect and mobilize the disc fragment and delivery with the Perth 4.  Multiple large fragments of disc material were removed.  At this point the L5 nerve root now was fully decompressed.  It was very mobile it was no longer dorsally displaced.  I then gently retracted the thecal sac and nerve root and continued removing fragments of disc material.  I then used bipolar cautery to cauterize the epidural veins and obtained hemostasis.  At this point I have evaluated the amount of disc material that had removed and was consistent with the size of the disc fragment seen on the preoperative MRI.  The L5 nerve root was freely mobile and I could easily pass my [facility] along the annulus medially, superiorly, inferiorly, and out into the L5 foramen.  The thecal sac and nerve root were completely decompressed and there were no further fragments of disc  material.  A micropituitary Roger was used to pass into the actual intervertebral space and remove some small fragments of loose disc material.  At this point I irrigated wound copiously with normal saline and obtained hemostasis using bipolar cautery and FloSeal.  After final irrigation I did place 20 mg (1/2 cc) of Depo-Medrol over the nerve root to aid in postoperative analgesia.  Thrombin-soaked Gelfoam patty was placed over the laminotomy site and I remove the Immunologist.  I then closed the deep fascia with interrupted #1 Vicryl sutures, then a layer of 0 Vicryl suture, 2-0 Vicryl suture, and 3-0 Monocryl for the skin.  Steri-Strips and dry dressings were applied the patient was ultimately extubated transferred to the PACU without incident.  End of the case all needle sponge counts were correct.  There were no adverse intraoperative events.

## 2020-04-09 NOTE — OR Nursing (Signed)
Per protocol Dr. Golden Circle called at [phone] on April 09, 2020 and confirmed retractor and instrumentation was located at L4-L5 interspace.

## 2020-04-09 NOTE — H&P (Signed)
History: Russell Odom is a pleasant 64 year old male The past medical history of low back pain and intermittent episodes of radicular leg pain who was doing well with conservative treatment including injection therapy until his most recent injection on 4/27 (left L5-S1 ESI); following this injection, patient began having worsening radicular leg pain that is constant as well as weakness in his left foot. Denies any loss of bladder or bowel control. Has been taking tramadol as needed for pain. No previous lumbar spine surgery.  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  Allergies NKDA  Medications allopurinoL 300 mg tablet aspirin 25 mg-dipyridamole 200 mg capsule,ext.release 12 hr multiphase benazepriL 20 mg tablet gabapentin 300 mg capsule levothyroxine 50 mcg tablet rosuvastatin 20 mg tablet tamsulosin 0.4 mg capsule traMADoL 50 mg tablet  Problems Non-smoker Occupation: Retire Chewing tobacco: none Alcohol intake: None Marital status: Married  Surgical History Patient indicated no previous surgeries   Past Medical History Dizziness: Y High Cholesterol: Y Stroke: Y Stroke/TIA: Y  ROS As per HPI. Otherwise it is negative  Physical Exam Patient is a 64 year old male.  General: AAOX3, well developed and well nourished, NAD Ambulation: abnormal gait pattern; Due the left foot drop, uses no assistive device. Inspection: No obvious deformity, scoleosis, kyphosis, loss of lordotic curve.  Heart: Regular rate and rhythm, no rubs, murmurs, or gallops  Lungs: Clear to auscultation bilaterally.  Abdomen: Bowel sounds 4, nondistended, nontender, no rebound tenderness. No loss of bladder or bowel control  AROM: - Knee: flexion and extension normal and pain free bilaterally. - Ankle: Dorsiflexion, plantarflexion, inversion, eversion normal and pain free.  Dermatomes: Lower extremity sensation to light touch abnormal With pain and dysesthesias in  the left L5 dermatome pattern  Myotomes: - Hip Flexion: Left 5/5, Right 5/5 - Knee Extension: Left 5/5, Right 5/5 - Ankle Dorsiflextion: Left 4/5, Right 5/5 - Ankle Plantarflexion: Left 5/5, Right 5/5  Reflexes: - Patella: Left2+, Right 2+ - Achilles: Left2+, Right 2+ - Babinski: Left Ngative, Right Negative - Clonus: Negative  Special Tests: - Straight Leg Raise: Left Positive, Right Negative  PV: Extremities warm and well profused. Posterior and dorsalis pedis pulse 2+ bilaterally, No pitting Edema, discoloration, calf tenderness  X-Ray impression: AP, lateral,, spot lumbar films were taken on 01/17/2020. These images were personally reviewed by me. There is no significant slip and disc space height is well maintained throughout with mild degenerative changes. Normal alignment, no scoliosis. No compression deformity is seen.  MRI Impression: MRI of lumbar spine was performed at an outside facility dated 03/11/2020. Both images as well as the report where personally reviewed by me. There is superimposed stenosis at L4-5 with a large left paracentral disc herniation contributing to asymmetric left recess impingement exerting mass-effect on the descending nerve roots, most notably left L5 nerve root.  Assesment/Plan:  Diagnosis: Thi is a very pleasant 64 year old on with a 6 week history of severe progressive radicular left leg pain. Clinical exam demonstrates left L5 motor and sensory deficits with a foot drop along with dysesthesias and pain. He has a positive nerve root tension sign (straight leg raise test). Imaging studies clearly demonstrate a large left posterior lateral disc herniation at L4-5 creating mass-effect on the traversing L5 nerve root.  Treatment plan: As a result of the severe radicular pain the patient has expressed a desire to move forward with surgery. Given the neurological deficits and severe radicular pain I believe this is reasonable. I have again  gone over the  risks and benefits of surgery with the patient and his wife all their questions were encouraged and addressed. We will of course get preoperative medical clearance from his primary care physician, and we will ensure that he can safely be off of his anticoagulation prior to surgery. We will move forward in a timely fashion.  Risks and benefits of surgery were discussed with the patient. These include: Infection, bleeding, death, stroke, paralysis, ongoing or worse pain, need for additional surgery, leak of spinal fluid, adjacent segment degeneration requiring additional surgery, post-operative hematoma formation that can result in neurological compromise and the need for urgent/emergent re-operation. Loss in bowel and bladder control. Injury to major vessels that could result in the need for urgent abdominal surgery to stop bleeding. Risk of deep venous thrombosis (DVT) and the need for additional treatment. Recurrent disc herniation resulting in the need for revision surgery, which could include fusion surgery (utilizing instrumentation such as pedicle screws and intervertebral cages).  We have also discussed the post-operative recovery period to include: bathing/showering restrictions, wound healing, activity (and driving) restrictions, medications/pain mangement.   We have also discussed post-operative redflags to include: signs and symptoms of postoperative infection, DVT/PE.

## 2020-04-09 NOTE — Anesthesia Procedure Notes (Signed)
Procedure Name: Intubation Date/Time: 04/09/2020 7:41 AM Performed by: Michele Rockers, CRNA Pre-anesthesia Checklist: Patient identified, Emergency Drugs available, Suction available and Patient being monitored Patient Re-evaluated:Patient Re-evaluated prior to induction Oxygen Delivery Method: Circle system utilized Preoxygenation: Pre-oxygenation with 100% oxygen Induction Type: IV induction Ventilation: Mask ventilation without difficulty and Oral airway inserted - appropriate to patient size Laryngoscope Size: Sabra Heck and 2 Grade View: Grade I Tube type: Oral Tube size: 8.0 mm Number of attempts: 1 Airway Equipment and Method: Stylet and Oral airway Placement Confirmation: ETT inserted through vocal cords under direct vision,  positive ETCO2 and breath sounds checked- equal and bilateral Secured at: 22 cm Tube secured with: Tape Dental Injury: Teeth and Oropharynx as per pre-operative assessment

## 2020-04-09 NOTE — Discharge Instructions (Signed)
°  Surgical Spinal Decompression, Care After Refer to this sheet in the next few weeks. These instructions provide you with information about caring for yourself after your procedure. Your health care provider may also give you more specific instructions. Your treatment has been planned according to current medical practices, but problems sometimes occur. Call your health care provider if you have any problems or questions after your procedure. What can I expect after the procedure? It is common to have pain for the first few days after the procedure. Some people continue to have mild pain even after making a full recovery. Follow these instructions at home: Medicine  Take medicines only as directed by your health care provider.  Avoid taking over-the-counter pain medicines unless your health care provider tells you otherwise. These medicines interfere with the development and growth of new bone cells.  If you were prescribed a narcotic pain medicine, take it exactly as told by your health care provider. ? Do not drink alcohol while on the medicine. ? Do not drive while on the medicine. Injury care  Care for your back brace as told by your health care provider. Brace should be worn at all times for the first 6 weeks except when sleeping, bathing, and sitting leisurely around the home. You CANNOT drive while wearing the brace, however, you may wear it as a passenger.   If directed, apply ice to the injured area: ? Put ice in a plastic bag. ? Place a towel between your skin and the bag. ? Leave the ice on for 20 minutes, 2-3 times a day. Activity  Perform physical therapy exercises as told by your health care provider.  Exercise regularly. Start by taking short walks. Slowly increase your activity level over time. Gentle exercise helps to ease pain.  Sit, stand, walk, turn in bed, and reposition yourself as told by your health care provider. This will help to keep your spine in proper  alignment.  Avoid bending and twisting your body.  Avoid doing strenuous household chores, such as vacuuming.  Do not lift anything that is heavier than 10 lb (4.5 kg). Other Instructions  Keep all follow-up visits as directed by your health care provider. This is important.  Do not use any tobacco products, including cigarettes, chewing tobacco, or electronic cigarettes. If you need help quitting, ask your health care provider. Nicotine affects the way bones heal. Contact a health care provider if:  Your pain gets worse.  You have a fever.  You have redness, swelling, or pain at the site of your incision.  You have fluid, blood, or pus coming from your incision.  You have numbness, tingling, or weakness in any part of your body. Get help right away if:  Your incision feels swollen and tender, and the surrounding area looks like a lump. The lump may be red or bluish in color.  You cannot move any part of your body (paralysis).  You cannot control your bladder or bowels. This information is not intended to replace advice given to you by your health care provider. Make sure you discuss any questions you have with your health care provider.  RESTART AGGRENOX (ANTICOAGULATION) ON 04/11/20 - Friday. CALL MD IF IBCREASED BLEEDING, WORSENING LEG PAIN, OR LOSS IN BOWEL/BLADDER CONTROL

## 2020-04-10 ENCOUNTER — Encounter (HOSPITAL_COMMUNITY): Payer: Self-pay | Admitting: Orthopedic Surgery

## 2020-04-10 MED FILL — Thrombin (Recombinant) For Soln 20000 Unit: CUTANEOUS | Qty: 1 | Status: AC

## 2020-05-08 DIAGNOSIS — E7800 Pure hypercholesterolemia, unspecified: Secondary | ICD-10-CM | POA: Diagnosis not present

## 2020-05-08 DIAGNOSIS — Z800 Family history of malignant neoplasm of digestive organs: Secondary | ICD-10-CM | POA: Diagnosis not present

## 2020-05-08 DIAGNOSIS — Z125 Encounter for screening for malignant neoplasm of prostate: Secondary | ICD-10-CM | POA: Diagnosis not present

## 2020-05-08 DIAGNOSIS — I10 Essential (primary) hypertension: Secondary | ICD-10-CM | POA: Diagnosis not present

## 2020-05-08 DIAGNOSIS — M5126 Other intervertebral disc displacement, lumbar region: Secondary | ICD-10-CM | POA: Diagnosis not present

## 2020-05-08 DIAGNOSIS — Z7689 Persons encountering health services in other specified circumstances: Secondary | ICD-10-CM | POA: Diagnosis not present

## 2020-05-08 DIAGNOSIS — E039 Hypothyroidism, unspecified: Secondary | ICD-10-CM | POA: Diagnosis not present

## 2020-05-08 DIAGNOSIS — E790 Hyperuricemia without signs of inflammatory arthritis and tophaceous disease: Secondary | ICD-10-CM | POA: Diagnosis not present

## 2020-05-21 ENCOUNTER — Ambulatory Visit: Payer: PPO | Attending: Orthopedic Surgery | Admitting: Physical Therapy

## 2020-05-21 ENCOUNTER — Encounter: Payer: Self-pay | Admitting: Physical Therapy

## 2020-05-21 ENCOUNTER — Other Ambulatory Visit: Payer: Self-pay

## 2020-05-21 DIAGNOSIS — M6281 Muscle weakness (generalized): Secondary | ICD-10-CM

## 2020-05-21 DIAGNOSIS — M5442 Lumbago with sciatica, left side: Secondary | ICD-10-CM | POA: Insufficient documentation

## 2020-05-21 DIAGNOSIS — R293 Abnormal posture: Secondary | ICD-10-CM | POA: Diagnosis not present

## 2020-05-21 NOTE — Therapy (Signed)
[facility] [place], [place], [address] Phone: [phone]   Fax:  [phone]  Physical Therapy Evaluation  Patient Details  Name: Russell Odom MRN: [id number] Date of Birth: 1956/07/11 Referring Provider (PT): Melina Schools, MD   Encounter Date: 05/21/2020   PT End of Session - 05/21/20 1210    Visit Number 1    Number of Visits 8    Date for PT Re-Evaluation 06/25/20    Authorization Type FOTO (IE: 40% limitation) ; progress note every 10th visit    PT Start Time 1030    PT Stop Time 1108    PT Time Calculation (min) 38 min    Activity Tolerance Patient tolerated treatment well    Behavior During Therapy [facility] for tasks assessed/performed           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   History of esophageal dilatation    History of kidney stones    HTN (hypertension)    Hypothyroid    Mixed hyperlipidemia     Past Surgical History:  Procedure Laterality Date   BIOPSY  02/01/2018   Procedure: BIOPSY;  Surgeon: Daneil Dolin, MD;  [facility];  Service: Endoscopy;;  ileocecal valve   COLONOSCOPY    07/04/2003   RMR: anal canal hemorrhoids, normal colon   COLONOSCOPY  11/02/2012   Procedure: COLONOSCOPY;  Surgeon: Daneil Dolin, MD;  [facility];  Service: Endoscopy;  Laterality: N/A;  9:30   COLONOSCOPY N/A 02/01/2018   Procedure: COLONOSCOPY;  Surgeon: Daneil Dolin, MD;  [facility];  Service: Endoscopy;  Laterality: N/A;  9:45   ESOPHAGOGASTRODUODENOSCOPY  07/04/2003   RMR: Multiple linear esophageal erosions consistent with mild-to-moderate  erosive reflux esophagitis,  status post passage of a 55 F Maloney dilator/ Multiple antral and duodenal bulbar erosions; otherwise, the remainder of the stomach and the first and second portions of the duodenum appeared normal   EXTRACORPOREAL SHOCK WAVE LITHOTRIPSY Right 11/13/2018   Procedure: EXTRACORPOREAL SHOCK WAVE LITHOTRIPSY (ESWL);  Surgeon: Lucas Mallow, MD;  [facility];  Service: Urology;  Laterality: Right;   EXTRACORPOREAL SHOCK WAVE LITHOTRIPSY Right 01/29/2019   Procedure: EXTRACORPOREAL SHOCK WAVE LITHOTRIPSY (ESWL);  Surgeon: Lucas Mallow, MD;  [facility];  Service: Urology;  Laterality: Right;   LUMBAR LAMINECTOMY/DECOMPRESSION MICRODISCECTOMY Left 04/09/2020   Procedure: Lumbar four through lumbar five Discectomy left;  Surgeon: Melina Schools, MD;  Location: [facility];  Service: Orthopedics;  Laterality: Left;  2 hrs    There were no vitals filed for this visit.    Subjective Assessment - 05/21/20 1201    Subjective COVID-19 screening performed upon arrival.Patient arrives to physical therapy with reports of low back pain and left lower leg pain that began after two falls in the past 6 months. Patient reported slipping down a bank then falling while going through a doorway and symptoms of left LE neurological symptoms increased. Patient underwent an L4-L5 discectomy on 04/09/2020. Patient reports being consistent with wearing lumbar spine brace throughout the day. Patient reports pain with sitting for long periods of time and needs to stop and stand when driving. Patient  reported ability to perform basic ADLs independently with minimal pain. Patients goals are to decrease pain and return to playing golf.    Pertinent History L4-L5 Discectomy 04/09/2020; HTN    Limitations Sitting;House hold activities    Diagnostic tests MRI: confirmed ruptured disc    Patient Stated Goals return to playing golf    Currently in Pain? Yes    Pain Score --   "mild"   Pain Location Leg    Pain Orientation  Left;Lateral;Lower    Pain Descriptors / Indicators Burning    Pain Type Surgical pain    Pain Onset More than a month ago    Pain Frequency Intermittent    Aggravating Factors  sitting too long    Effect of Pain on Daily Activities play golf              [facility] PT Assessment - 05/21/20 0001      Assessment   Medical Diagnosis Radiculopathy, lumbar region; s/p Left L4-L5 discectomy    Referring Provider (PT) Melina Schools, MD    Onset Date/Surgical Date 04/09/20    Next MD Visit 05/23/2020    Prior Therapy yes, prior to surgery      Precautions   Precautions Back    Precaution Comments No bending, lifting, twisting    Required Braces or Orthoses Spinal Brace      Restrictions   Weight Bearing Restrictions No      Balance Screen   Has the patient fallen in the past 6 months Yes    How many times? 2    Has the patient had a decrease in activity level because of a fear of falling?  Yes    Is the patient reluctant to leave their home because of a fear of falling?  No      Home Ec[employer] residence    Living Arrangements Spouse/significant other      Prior Function   Level of Independence Independent with basic ADLs      Observation/Other Assessments   Skin Integrity incision healed well but with increased palpable scar tissue     Focus on Therapeutic Outcomes (FOTO)  40% limitation      Deep Tendon Reflexes   DTR Assessment Site Patella;Achilles    Patella DTR 2+   normal bilaterally   Achilles DTR 1+   normal R LE     ROM / Strength   AROM / PROM / Strength Strength      Strength   Strength Assessment Site Hip;Knee    Right/Left Hip Right;Left    Right Hip Flexion 4/5    Right Hip Extension 4-/5    Right Hip ABduction 4-/5    Left Hip Flexion 4/5    Left Hip Extension 4-/5    Left Hip ABduction 4-/5    Right/Left Knee Right;Left    Right Knee Flexion 4+/5    Right Knee Extension 5/5    Left Knee Flexion 4+/5    Left Knee  Extension 5/5      Palpation   Palpation comment increased bilateral thoracolumbar paraspinal tone; minimal tenderness to palpation      Transfers   Transfers Independent with all Transfers      Ambulation/Gait   Ambulation/Gait Yes    Gait Pattern Within Functional Limits                      Objective measurements completed on examination: See above findings.  PT Education - 05/21/20 1209    Education Details draw ins, bridge, hamstring stretch    Person(s) Educated Patient    Methods Explanation;Demonstration;Handout    Comprehension Verbalized understanding;Returned demonstration            PT Short Term Goals - 05/21/20 1210      PT SHORT TERM GOAL #1   Title STG=LTG             PT Long Term Goals - 05/21/20 1210      PT LONG TERM GOAL #1   Title Patient will be independent with HEP and progression    Time 4    Period Weeks    Status New      PT LONG TERM GOAL #2   Title Patient will report ability to sit for 30+ minutes with 3/10 low back pain during driving.    Time 4    Period Weeks    Status New      PT LONG TERM GOAL #3   Title Patient will report ability to perform ADLs and home activities  with low back pain and lower leg pain less than or equal to 3/10.    Time 4    Period Weeks    Status New      PT LONG TERM GOAL #4   Title Patient will demonstrate proper lifting mechanics to safely protect spine during lifting activities.    Time 4    Period Weeks    Status New                  Plan - 05/21/20 1251    Clinical Impression Statement Patient is a 64 year old male who presents to physical therapy with low back pain, decreased lumbar lordosis, and decrease of bilateral hip MMT secondary to an L4-L5 discectomy on 04/09/2020. Patient with notable tone in bilateral thoracolumbar paraspinals in sitting as well as increased palpable scar tissue when scar was assessed. Proper bed mobility and transfers  were observed to protect spine. Patient and PT discussed plan of care and discussed HEP to which he reported understanding. Patient would benefit from skilled physical therapy to address deficits and goals.    Personal Factors and Comorbidities Comorbidity 2    Comorbidities L4-L5 lumbar discectomy 04/09/2020; HTN    Examination-Activity Limitations Sit    Examination-Participation Restrictions Driving    Stability/Clinical Decision Making Stable/Uncomplicated    Clinical Decision Making Low    Rehab Potential Good    PT Frequency 2x / week    PT Duration 4 weeks    PT Treatment/Interventions ADLs/Self Care Home Management;Cryotherapy;Electrical Stimulation;Moist Heat;Gait training;Stair training;Functional mobility training;Therapeutic activities;Therapeutic exercise;Neuromuscular re-education;Balance training;Manual techniques;Passive range of motion;Patient/family education    PT Next Visit Plan nustep or bike; core strengthening and stability, supine LE strengthening and stretching; modalities PRN for pain relief.    PT Home Exercise Plan see patient education section    Consulted and Agree with Plan of Care Patient           Patient will benefit from skilled therapeutic intervention in order to improve the following deficits and impairments:  Decreased activity tolerance, Decreased strength, Postural dysfunction, Pain, Decreased scar mobility, Increased edema, Decreased range of motion  Visit Diagnosis: Acute low back pain with left-sided sciatica, unspecified back pain laterality - Plan: PT plan of care cert/re-cert  Muscle weakness (generalized) - Plan: PT plan of care cert/re-cert  Abnormal posture - Plan: PT plan of care cert/re-cert  Problem List Patient Active Problem List   Diagnosis Date Noted   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 [place], [place], [address] Phone: [phone]   Fax:  [phone]  Name: KHYLAN SAWYER MRN: [id number] Date of Birth: 08-Jul-1956

## 2020-05-26 ENCOUNTER — Other Ambulatory Visit: Payer: Self-pay

## 2020-05-26 ENCOUNTER — Encounter: Payer: Self-pay | Admitting: Physical Therapy

## 2020-05-26 ENCOUNTER — Ambulatory Visit: Payer: PPO | Admitting: Physical Therapy

## 2020-05-26 DIAGNOSIS — M6281 Muscle weakness (generalized): Secondary | ICD-10-CM

## 2020-05-26 DIAGNOSIS — R293 Abnormal posture: Secondary | ICD-10-CM

## 2020-05-26 DIAGNOSIS — M5442 Lumbago with sciatica, left side: Secondary | ICD-10-CM | POA: Diagnosis not present

## 2020-05-26 NOTE — Therapy (Signed)
[facility] [place], [place], [address] Phone: [phone]   Fax:  [phone]  Physical Therapy Treatment  Patient Details  Name: Russell Odom MRN: [id number] Date of Birth: 11/13/1955 Referring Provider (PT): Melina Schools, MD   Encounter Date: 05/26/2020   PT End of Session - 05/26/20 0917    Visit Number 2    Number of Visits 8    Date for PT Re-Evaluation 06/25/20    Authorization Type FOTO (IE: 40% limitation) ; progress note every 10th visit    PT Start Time 0900    PT Stop Time 0944    PT Time Calculation (min) 44 min    Activity Tolerance Patient tolerated treatment well    Behavior During Therapy [facility] for tasks assessed/performed           Past Medical History:  Diagnosis Date  . Arthritis, gouty   . 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 History of esophageal dilatation   . History of kidney stones   . HTN (hypertension)   . Hypothyroid   . Mixed hyperlipidemia     Past Surgical History:  Procedure Laterality Date  . BIOPSY  02/01/2018   Procedure: BIOPSY;  Surgeon: Daneil Dolin, MD;  [facility];  Service: Endoscopy;;  ileocecal valve  . COLONOSCOPY    07/04/2003   RMR: anal canal hemorrhoids, normal colon  . COLONOSCOPY  11/02/2012   Procedure: COLONOSCOPY;  Surgeon: Daneil Dolin, MD;  [facility];  Service: Endoscopy;  Laterality: N/A;  9:30  . COLONOSCOPY N/A 02/01/2018   Procedure: COLONOSCOPY;  Surgeon: Daneil Dolin, MD;  [facility];  Service: Endoscopy;  Laterality: N/A;  9:45  . ESOPHAGOGASTRODUODENOSCOPY  07/04/2003   RMR: Multiple linear esophageal erosions consistent with mild-to-moderate  erosive reflux esophagitis,  status post passage of a 51 F Maloney dilator/ Multiple antral and duodenal bulbar erosions; otherwise, the remainder of the stomach and the first and second portions of the duodenum appeared normal  . EXTRACORPOREAL SHOCK WAVE LITHOTRIPSY Right 11/13/2018   Procedure: EXTRACORPOREAL SHOCK WAVE LITHOTRIPSY (ESWL);  Surgeon: Lucas Mallow, MD;  [facility];  Service: Urology;  Laterality: Right;  . EXTRACORPOREAL SHOCK WAVE LITHOTRIPSY Right 01/29/2019   Procedure: EXTRACORPOREAL SHOCK WAVE LITHOTRIPSY (ESWL);  Surgeon: Lucas Mallow, MD;  [facility];  Service: Urology;  Laterality: Right;  . LUMBAR LAMINECTOMY/DECOMPRESSION MICRODISCECTOMY Left 04/09/2020   Procedure: Lumbar four through lumbar five Discectomy left;  Surgeon: Melina Schools, MD;  Location: [place] [facility];  Service: Orthopedics;  Laterality: Left;  2 hrs    There were no vitals filed for this visit.   Subjective Assessment - 05/26/20 0913    Subjective COVID-19 screening performed upon arrival. Patient arrived feeling good just with left lateral calf pain.    Pertinent History L4-L5 Discectomy 04/09/2020; HTN    Limitations Sitting;House hold activities    Diagnostic tests MRI: confirmed ruptured disc    Patient Stated Goals return to playing golf    Currently in Pain? Yes    Pain Location Leg    Pain Orientation Left;Lower;Lateral    Pain Descriptors / Indicators Burning    Pain Onset More than a month ago  Pain Frequency Constant              [facility] PT Assessment - 05/26/20 0001      Assessment   Medical Diagnosis Radiculopathy, lumbar region; s/p Left L4-L5 discectomy    Referring Provider (PT) Melina Schools, MD    Onset Date/Surgical Date 04/09/20    Next MD Visit 07/04/2020    Prior Therapy yes, prior to surgery      Precautions   Precautions Back    Precaution Comments No bending, lifting, twisting    Required Braces or Orthoses Spinal Brace      Restrictions   Weight Bearing Restrictions No                          [facility] Adult PT Treatment/Exercise - 05/26/20 0001      Exercises   Exercises Lumbar      Lumbar Exercises: Stretches   Passive Hamstring Stretch 3 reps;30 seconds    Quad Stretch Right;Left;3 reps;30 seconds      Lumbar Exercises: Aerobic   Nustep level 5 x10 mins, monitored      Lumbar Exercises: Standing   Other Standing Lumbar Exercises Rockerboard x3 minutes      Lumbar Exercises: Supine   Clam 20 reps;3 seconds    Bridge 20 reps;3 seconds    Straight Leg Raise 20 reps;3 seconds      Lumbar Exercises: Sidelying   Hip Abduction Both;20 reps;3 seconds      Lumbar Exercises: Prone   Straight Leg Raise 20 reps;3 seconds                    PT Short Term Goals - 05/21/20 1210      PT SHORT TERM GOAL #1   Title STG=LTG             PT Long Term Goals - 05/21/20 1210      PT LONG TERM GOAL #1   Title Patient will be independent with HEP and progression    Time 4    Period Weeks    Status New      PT LONG TERM GOAL #2   Title Patient will report ability to sit for 30+ minutes with 3/10 low back pain during driving.    Time 4    Period Weeks    Status New      PT LONG TERM GOAL #3   Title Patient will report ability to perform ADLs and home activities  with low back pain and lower leg pain less than or equal to 3/10.    Time 4    Period Weeks    Status New      PT LONG TERM GOAL #4   Title Patient will demonstrate proper lifting mechanics to safely protect spine during lifting activities.    Time 4    Period Weeks    Status New                 Plan - 05/26/20 7846    Clinical Impression Statement Patient responded well to therapy session with no reports of increased pain. Patient guided through TEs to which he was able to demonstrate good form and technique after expanation and demonstration. Patient denied any increase of symptoms with TEs. Patient and PT discussed plan of care for therapy and slow  progression into strengthening TEs.    Personal Factors and Comorbidities Comorbidity 2    Comorbidities L4-L5 lumbar  discectomy 04/09/2020; HTN    Examination-Activity Limitations Sit    Examination-Participation Restrictions Driving    Stability/Clinical Decision Making Stable/Uncomplicated    Clinical Decision Making Low    Rehab Potential Good    PT Frequency 2x / week    PT Duration 4 weeks    PT Treatment/Interventions ADLs/Self Care Home Management;Cryotherapy;Electrical Stimulation;Moist Heat;Gait training;Stair training;Functional mobility training;Therapeutic activities;Therapeutic exercise;Neuromuscular re-education;Balance training;Manual techniques;Passive range of motion;Patient/family education    PT Next Visit Plan nustep or bike; core strengthening and stability, supine LE strengthening and stretching; modalities PRN for pain relief.    PT Home Exercise Plan see patient education section    Consulted and Agree with Plan of Care Patient           Patient will benefit from skilled therapeutic intervention in order to improve the following deficits and impairments:  Decreased activity tolerance, Decreased strength, Postural dysfunction, Pain, Decreased scar mobility, Increased edema, Decreased range of motion  Visit Diagnosis: Acute low back pain with left-sided sciatica, unspecified back pain laterality  Muscle weakness (generalized)  Abnormal posture     Problem List Patient Active Problem List   Diagnosis Date Noted  . 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 Hematochezia 10/19/2012  . Family history of colon cancer 10/19/2012  . UNSPECIFIED HYPOTHYROIDISM 06/21/2009  . 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 HYPERLIPIDEMIA, MIXED 06/21/2009  . GOUT 06/21/2009  . Essential hypertension 06/21/2009    Gabriela Eves, PT, DPT 05/26/2020, 9:54 AM  [facility] [address] [place], [place], [address] Phone: [phone]   Fax:  [phone]  Name: Russell Odom MRN: [id number] Date of Birth: 07-21-1956

## 2020-05-29 ENCOUNTER — Other Ambulatory Visit: Payer: Self-pay

## 2020-05-29 ENCOUNTER — Ambulatory Visit: Payer: PPO | Admitting: Physical Therapy

## 2020-05-29 DIAGNOSIS — M5442 Lumbago with sciatica, left side: Secondary | ICD-10-CM

## 2020-05-29 DIAGNOSIS — R293 Abnormal posture: Secondary | ICD-10-CM

## 2020-05-29 DIAGNOSIS — M6281 Muscle weakness (generalized): Secondary | ICD-10-CM

## 2020-05-29 NOTE — Therapy (Signed)
[facility] [place], [place], [address] Phone: [phone]   Fax:  [phone]  Physical Therapy Treatment  Patient Details  Name: Russell Odom MRN: [id number] Date of Birth: May 29, 1956 Referring Provider (PT): Melina Schools, MD   Encounter Date: 05/29/2020   PT End of Session - 05/29/20 0910    Visit Number 3    Number of Visits 8    Date for PT Re-Evaluation 06/25/20    Authorization Type FOTO (IE: 40% limitation) ; progress note every 10th visit    PT Start Time 0901    PT Stop Time 0941    PT Time Calculation (min) 40 min    Activity Tolerance Patient tolerated treatment well    Behavior During Therapy [facility] for tasks assessed/performed           Past Medical History:  Diagnosis Date  . Arthritis, gouty   . 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 History of esophageal dilatation   . History of kidney stones   . HTN (hypertension)   . Hypothyroid   . Mixed hyperlipidemia     Past Surgical History:  Procedure Laterality Date  . BIOPSY  02/01/2018   Procedure: BIOPSY;  Surgeon: Daneil Dolin, MD;  [facility];  Service: Endoscopy;;  ileocecal valve  . COLONOSCOPY    07/04/2003   RMR: anal canal hemorrhoids, normal colon  . COLONOSCOPY  11/02/2012   Procedure: COLONOSCOPY;  Surgeon: Daneil Dolin, MD;  [facility];  Service: Endoscopy;  Laterality: N/A;  9:30  . COLONOSCOPY N/A 02/01/2018   Procedure: COLONOSCOPY;  Surgeon: Daneil Dolin, MD;  [facility];  Service: Endoscopy;  Laterality: N/A;  9:45  . ESOPHAGOGASTRODUODENOSCOPY  07/04/2003   RMR: Multiple linear esophageal erosions consistent with mild-to-moderate  erosive reflux esophagitis,  status post passage of a 50 F Maloney dilator/ Multiple antral and duodenal bulbar erosions; otherwise, the remainder of the stomach and the first and second portions of the duodenum appeared normal  . EXTRACORPOREAL SHOCK WAVE LITHOTRIPSY Right 11/13/2018   Procedure: EXTRACORPOREAL SHOCK WAVE LITHOTRIPSY (ESWL);  Surgeon: Lucas Mallow, MD;  [facility];  Service: Urology;  Laterality: Right;  . EXTRACORPOREAL SHOCK WAVE LITHOTRIPSY Right 01/29/2019   Procedure: EXTRACORPOREAL SHOCK WAVE LITHOTRIPSY (ESWL);  Surgeon: Lucas Mallow, MD;  [facility];  Service: Urology;  Laterality: Right;  . LUMBAR LAMINECTOMY/DECOMPRESSION MICRODISCECTOMY Left 04/09/2020   Procedure: Lumbar four through lumbar five Discectomy left;  Surgeon: Melina Schools, MD;  Location: [place];  Service: Orthopedics;  Laterality: Left;  2 hrs    There were no vitals filed for this visit.   Subjective Assessment - 05/29/20 0909    Subjective COVID-19 screening performed upon arrival. Patient arrived with no pain.    Pertinent History L4-L5 Discectomy 04/09/2020; HTN    Limitations Sitting;House hold activities    Diagnostic tests MRI: confirmed ruptured disc    Patient Stated Goals return to playing golf    Currently in Pain? No/denies                             [facility] Adult PT Treatment/Exercise - 05/29/20 0001  Self-Care   Self-Care Lifting;Other Self-Care Comments;ADL's;Posture    ADL's brushing teeth, mowing, sleeping    Lifting power lift, with proper body mechanics w red swiss ball x10    Posture all positions      Lumbar Exercises: Stretches   Passive Hamstring Stretch 3 reps;30 seconds      Lumbar Exercises: Aerobic   Nustep level 5 x10 mins, monitored      Lumbar Exercises: Standing   Row Strengthening;Both;20 reps    Row Limitations blue XTS    Shoulder Extension Strengthening;Both;20 reps    Shoulder Extension Limitations blue XTS    Other Standing Lumbar  Exercises Rockerboard x3 minutes    Other Standing Lumbar Exercises diagnols x20 each side w blue XTS      Lumbar Exercises: Supine   Bridge with clamshell 20 reps   green band   Straight Leg Raise Other (comment)   2x10     Lumbar Exercises: Sidelying   Hip Abduction Both;20 reps;3 seconds                    PT Short Term Goals - 05/21/20 1210      PT SHORT TERM GOAL #1   Title STG=LTG             PT Long Term Goals - 05/29/20 0910      PT LONG TERM GOAL #1   Title Patient will be independent with HEP and progression    Time 4    Period Weeks    Status On-going      PT LONG TERM GOAL #2   Title Patient will report ability to sit for 30+ minutes with 3/10 low back pain during driving.    Time 4    Period Weeks    Status On-going      PT LONG TERM GOAL #3   Title Patient will report ability to perform ADLs and home activities  with low back pain and lower leg pain less than or equal to 3/10.    Time 4    Period Weeks    Status On-going      PT LONG TERM GOAL #4   Title Patient will demonstrate proper lifting mechanics to safely protect spine during lifting activities.    Baseline Met 05/29/20    Time 4    Period Weeks    Status Achieved                 Plan - 05/29/20 3419    Clinical Impression Statement Patient tolerated treatment well today. Patient was educated patient on posture awareness techniques and lifting with proper body mechanics. Patient able to progress with core activation exercises with no difficulty or pain. Patient progressing toward goals and met #4 today.    Personal Factors and Comorbidities Comorbidity 2    Comorbidities L4-L5 lumbar discectomy 04/09/2020; HTN    Examination-Activity Limitations Sit    Examination-Participation Restrictions Driving    Stability/Clinical Decision Making Stable/Uncomplicated    Rehab Potential Good    PT Frequency 2x / week    PT Duration 4 weeks    PT Treatment/Interventions ADLs/Self  Care Home Management;Cryotherapy;Electrical Stimulation;Moist Heat;Gait training;Stair training;Functional mobility training;Therapeutic activities;Therapeutic exercise;Neuromuscular re-education;Balance training;Manual techniques;Passive range of motion;Patient/family education    PT Next Visit Plan nustep or bike; core strengthening and stability, supine LE strengthening and stretching; modalities PRN for pain relief.    Consulted and Agree with Plan of Care Patient  Patient will benefit from skilled therapeutic intervention in order to improve the following deficits and impairments:  Decreased activity tolerance, Decreased strength, Postural dysfunction, Pain, Decreased scar mobility, Increased edema, Decreased range of motion  Visit Diagnosis: Acute low back pain with left-sided sciatica, unspecified back pain laterality  Muscle weakness (generalized)  Abnormal posture     Problem List Patient Active Problem List   Diagnosis Date Noted  . 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 Hematochezia 10/19/2012  . Family history of colon cancer 10/19/2012  . UNSPECIFIED HYPOTHYROIDISM 06/21/2009  . 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 HYPERLIPIDEMIA, MIXED 06/21/2009  . GOUT 06/21/2009  . Essential hypertension 06/21/2009    Russell Odom, Russell Odom 05/29/2020, 9:45 AM  [facility] [place], [place], [address] Phone: [phone]   Fax:  [phone]  Name: Russell Odom MRN: [id number] Date of Birth: [birth year]

## 2020-06-03 ENCOUNTER — Ambulatory Visit: Payer: PPO | Admitting: Physical Therapy

## 2020-06-03 ENCOUNTER — Encounter: Payer: Self-pay | Admitting: Physical Therapy

## 2020-06-03 ENCOUNTER — Other Ambulatory Visit: Payer: Self-pay

## 2020-06-03 DIAGNOSIS — M5442 Lumbago with sciatica, left side: Secondary | ICD-10-CM

## 2020-06-03 DIAGNOSIS — R293 Abnormal posture: Secondary | ICD-10-CM

## 2020-06-03 DIAGNOSIS — M6281 Muscle weakness (generalized): Secondary | ICD-10-CM

## 2020-06-03 NOTE — Therapy (Signed)
[facility] [place], [place], [address] Phone: [phone]   Fax:  [phone]  Physical Therapy Treatment  Patient Details  Name: Russell Odom MRN: [id number] Date of Birth: May 16, 1956 Referring Provider (PT): Melina Schools, MD   Encounter Date: 06/03/2020   PT End of Session - 06/03/20 0747    Visit Number 4    Number of Visits 8    Date for PT Re-Evaluation 06/25/20    Authorization Type FOTO (IE: 40% limitation) ; progress note every 10th visit    PT Start Time 0732    PT Stop Time 0812    PT Time Calculation (min) 40 min    Activity Tolerance Patient tolerated treatment well    Behavior During Therapy [facility] for tasks assessed/performed           Past Medical History:  Diagnosis Date  . Arthritis, gouty   . 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 History of esophageal dilatation   . History of kidney stones   . HTN (hypertension)   . Hypothyroid   . Mixed hyperlipidemia     Past Surgical History:  Procedure Laterality Date  . BIOPSY  02/01/2018   Procedure: BIOPSY;  Surgeon: Daneil Dolin, MD;  [facility];  Service: Endoscopy;;  ileocecal valve  . COLONOSCOPY    07/04/2003   RMR: anal canal hemorrhoids, normal colon  . COLONOSCOPY  11/02/2012   Procedure: COLONOSCOPY;  Surgeon: Daneil Dolin, MD;  [facility];  Service: Endoscopy;  Laterality: N/A;  9:30  . COLONOSCOPY N/A 02/01/2018   Procedure: COLONOSCOPY;  Surgeon: Daneil Dolin, MD;  [facility];  Service: Endoscopy;  Laterality: N/A;  9:45  . ESOPHAGOGASTRODUODENOSCOPY  07/04/2003   RMR: Multiple linear esophageal erosions consistent with mild-to-moderate  erosive reflux esophagitis,  status post passage of a 65 F Maloney dilator/ Multiple antral and duodenal bulbar erosions; otherwise, the remainder of the stomach and the first and second portions of the duodenum appeared normal  . EXTRACORPOREAL SHOCK WAVE LITHOTRIPSY Right 11/13/2018   Procedure: EXTRACORPOREAL SHOCK WAVE LITHOTRIPSY (ESWL);  Surgeon: Lucas Mallow, MD;  [facility];  Service: Urology;  Laterality: Right;  . EXTRACORPOREAL SHOCK WAVE LITHOTRIPSY Right 01/29/2019   Procedure: EXTRACORPOREAL SHOCK WAVE LITHOTRIPSY (ESWL);  Surgeon: Lucas Mallow, MD;  [facility];  Service: Urology;  Laterality: Right;  . LUMBAR LAMINECTOMY/DECOMPRESSION MICRODISCECTOMY Left 04/09/2020   Procedure: Lumbar four through lumbar five Discectomy left;  Surgeon: Melina Schools, MD;  Location: [place];  Service: Orthopedics;  Laterality: Left;  2 hrs    There were no vitals filed for this visit.   Subjective Assessment - 06/03/20 0747    Subjective COVID-19 screening performed upon arrival. Patient arrived with no pain.Still having some shin burning on LLE. More discomfort with prolonged sitting activities.    Pertinent History L4-L5 Discectomy 04/09/2020; HTN    Limitations Sitting;House hold activities    Diagnostic tests MRI: confirmed ruptured disc    Patient Stated Goals return to playing golf    Currently in Pain? No/denies              [facility] PT Assessment - 06/03/20 0001      Assessment  Medical Diagnosis Radiculopathy, lumbar region; s/p Left L4-L5 discectomy    Referring Provider (PT) Melina Schools, MD    Onset Date/Surgical Date 04/09/20    Next MD Visit 07/04/2020    Prior Therapy yes, prior to surgery      Precautions   Precautions Back    Precaution Comments No bending, lifting, twisting    Required Braces or Orthoses Spinal Brace      Restrictions   Weight Bearing Restrictions No                         [facility] Adult PT Treatment/Exercise - 06/03/20 0001      Lumbar  Exercises: Aerobic   Nustep L6 x10 min      Lumbar Exercises: Standing   Lifting From waist;20 reps    Lifting Weights (lbs) 8    Forward Lunge 15 reps;2 seconds    Forward Lunge Limitations 6# reachouts    Shoulder Extension Strengthening;Both;20 reps    Shoulder Extension Limitations Blue XTS    Other Standing Lumbar Exercises Rockerboard x3 minutes    Other Standing Lumbar Exercises Chop wood B blue XTS x20 reps      Lumbar Exercises: Seated   Long Arc Quad on Chair AROM;Both;15 reps    Hip Flexion on Ball AROM;Both;15 reps      Lumbar Exercises: Supine   Bridge 20 reps;5 seconds    Straight Leg Raise 20 reps;2 seconds      Lumbar Exercises: Sidelying   Hip Abduction Both;20 reps;2 seconds      Lumbar Exercises: Prone   Single Arm Raise Right;Left;20 reps    Straight Leg Raise 20 reps;2 seconds    Opposite Arm/Leg Raise Right arm/Left leg;Left arm/Right leg;20 reps                    PT Short Term Goals - 05/21/20 1210      PT SHORT TERM GOAL #1   Title STG=LTG             PT Long Term Goals - 05/29/20 0910      PT LONG TERM GOAL #1   Title Patient will be independent with HEP and progression    Time 4    Period Weeks    Status On-going      PT LONG TERM GOAL #2   Title Patient will report ability to sit for 30+ minutes with 3/10 low back pain during driving.    Time 4    Period Weeks    Status On-going      PT LONG TERM GOAL #3   Title Patient will report ability to perform ADLs and home activities  with low back pain and lower leg pain less than or equal to 3/10.    Time 4    Period Weeks    Status On-going      PT LONG TERM GOAL #4   Title Patient will demonstrate proper lifting mechanics to safely protect spine during lifting activities.    Baseline Met 05/29/20    Time 4    Period Weeks    Status Achieved                 Plan - 06/03/20 3338    Clinical Impression Statement Patient presented in clinic with reports of no  LBP upon arrival. Patient still has some discomfort with prolonged sitting activities. Patient progressed to more standing and with some weights to progress  back to function. Patient denied any LBP during treatment today. Patient also progressed into more lumbar stability exercises in prone with good stability noted. Patient instructed to be aware of restrictions such as lifting and technique of squatting.    Personal Factors and Comorbidities Comorbidity 2    Comorbidities L4-L5 lumbar discectomy 04/09/2020; HTN    Examination-Activity Limitations Sit    Examination-Participation Restrictions Driving    Stability/Clinical Decision Making Stable/Uncomplicated    Rehab Potential Good    PT Frequency 2x / week    PT Duration 4 weeks    PT Treatment/Interventions ADLs/Self Care Home Management;Cryotherapy;Electrical Stimulation;Moist Heat;Gait training;Stair training;Functional mobility training;Therapeutic activities;Therapeutic exercise;Neuromuscular re-education;Balance training;Manual techniques;Passive range of motion;Patient/family education    PT Next Visit Plan nustep or bike; core strengthening and stability, supine LE strengthening and stretching; modalities PRN for pain relief.    PT Home Exercise Plan see patient education section    Consulted and Agree with Plan of Care Patient           Patient will benefit from skilled therapeutic intervention in order to improve the following deficits and impairments:  Decreased activity tolerance, Decreased strength, Postural dysfunction, Pain, Decreased scar mobility, Increased edema, Decreased range of motion  Visit Diagnosis: Acute low back pain with left-sided sciatica, unspecified back pain laterality  Muscle weakness (generalized)  Abnormal posture     Problem List Patient Active Problem List   Diagnosis Date Noted  . 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 Hematochezia 10/19/2012   . Family history of colon cancer 10/19/2012  . UNSPECIFIED HYPOTHYROIDISM 06/21/2009  . 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 HYPERLIPIDEMIA, MIXED 06/21/2009  . GOUT 06/21/2009  . Essential hypertension 06/21/2009    Standley Brooking, PTA 06/03/2020, 8:19 AM  [facility] [address] [place], [place], [address] Phone: [phone]   Fax:  [phone]  Name: Russell Odom MRN: [id number] Date of Birth: 11/30/1955

## 2020-06-05 ENCOUNTER — Other Ambulatory Visit: Payer: Self-pay

## 2020-06-05 ENCOUNTER — Ambulatory Visit: Payer: PPO | Admitting: *Deleted

## 2020-06-05 DIAGNOSIS — R293 Abnormal posture: Secondary | ICD-10-CM

## 2020-06-05 DIAGNOSIS — M5442 Lumbago with sciatica, left side: Secondary | ICD-10-CM | POA: Diagnosis not present

## 2020-06-05 DIAGNOSIS — M6281 Muscle weakness (generalized): Secondary | ICD-10-CM

## 2020-06-05 NOTE — Therapy (Signed)
[facility] [place], [place], [address] Phone: [phone]   Fax:  [phone]  Physical Therapy Treatment  Patient Details  Name: Russell Odom MRN: [id number] Date of Birth: October 15, 1955 Referring Provider (PT): Melina Schools, MD   Encounter Date: 06/05/2020   PT End of Session - 06/05/20 0826    Visit Number 5    Number of Visits 8    Date for PT Re-Evaluation 06/25/20    Authorization Type FOTO (IE: 40% limitation) ; progress note every 10th visit    PT Start Time 0815    PT Stop Time 0904    PT Time Calculation (min) 49 min           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   History of esophageal dilatation    History of kidney stones    HTN (hypertension)    Hypothyroid    Mixed hyperlipidemia     Past Surgical History:  Procedure Laterality Date   BIOPSY  02/01/2018   Procedure: BIOPSY;  Surgeon: Daneil Dolin, MD;  [facility];  Service: Endoscopy;;  ileocecal valve   COLONOSCOPY    07/04/2003   RMR: anal canal hemorrhoids, normal colon   COLONOSCOPY  11/02/2012   Procedure: COLONOSCOPY;  Surgeon: Daneil Dolin, MD;  [facility];  Service: Endoscopy;  Laterality: N/A;  9:30   COLONOSCOPY N/A 02/01/2018   Procedure: COLONOSCOPY;  Surgeon: Daneil Dolin, MD;  [facility];  Service: Endoscopy;  Laterality: N/A;  9:45   ESOPHAGOGASTRODUODENOSCOPY  07/04/2003   RMR: Multiple linear esophageal erosions consistent with mild-to-moderate  erosive reflux esophagitis, status post passage of a 43 F Maloney dilator/ Multiple antral and duodenal bulbar erosions; otherwise, the remainder  of the stomach and the first and second portions of the duodenum appeared normal   EXTRACORPOREAL SHOCK WAVE LITHOTRIPSY Right 11/13/2018   Procedure: EXTRACORPOREAL SHOCK WAVE LITHOTRIPSY (ESWL);  Surgeon: Lucas Mallow, MD;  [facility];  Service: Urology;  Laterality: Right;   EXTRACORPOREAL SHOCK WAVE LITHOTRIPSY Right 01/29/2019   Procedure: EXTRACORPOREAL SHOCK WAVE LITHOTRIPSY (ESWL);  Surgeon: Lucas Mallow, MD;  [facility];  Service: Urology;  Laterality: Right;   LUMBAR LAMINECTOMY/DECOMPRESSION MICRODISCECTOMY Left 04/09/2020   Procedure: Lumbar four through lumbar five Discectomy left;  Surgeon: Melina Schools, MD;  Location: [place];  Service: Orthopedics;  Laterality: Left;  2 hrs    There were no vitals filed for this visit.   Subjective Assessment - 06/05/20 0824    Subjective COVID-19 screening performed upon arrival. Patient arrived with no pain.Still having some shin burning on LLE.  Just sore from exs    Pertinent History L4-L5 Discectomy 04/09/2020; HTN    Limitations Sitting;House hold activities    Diagnostic tests MRI: confirmed ruptured disc    Patient Stated Goals return to playing golf    Currently in Pain? No/denies    Pain Location Back    Pain Orientation Left;Lower;Lateral    Pain Descriptors / Indicators Burning    Pain Type Surgical pain    Pain Onset More than a month ago    Pain Frequency Constant  [facility] Adult PT Treatment/Exercise - 06/05/20 0001      Lumbar Exercises: Stretches   Other Lumbar Stretch Exercise standing glute and hip flexor stretch 2x 30 secs each side. Standing with foot on mat.      Lumbar Exercises: Aerobic   Nustep L6 x11 min      Lumbar Exercises: Standing   Forward Lunge 2 seconds;20 reps   2x10   Forward Lunge Limitations 6# reachouts   6# diagonal raises 2 x10   Shoulder Extension Strengthening;Both;20 reps;10 reps    Shoulder Extension Limitations Blue XTS     Other Standing Lumbar Exercises Rockerboard x3 minutes    Other Standing Lumbar Exercises Chop wood B blue XTS x20 reps      Lumbar Exercises: Seated   Sit to Stand 10 reps    Other Seated Lumbar Exercises seated extension                    PT Short Term Goals - 05/21/20 1210      PT SHORT TERM GOAL #1   Title STG=LTG             PT Long Term Goals - 05/29/20 0910      PT LONG TERM GOAL #1   Title Patient will be independent with HEP and progression    Time 4    Period Weeks    Status On-going      PT LONG TERM GOAL #2   Title Patient will report ability to sit for 30+ minutes with 3/10 low back pain during driving.    Time 4    Period Weeks    Status On-going      PT LONG TERM GOAL #3   Title Patient will report ability to perform ADLs and home activities  with low back pain and lower leg pain less than or equal to 3/10.    Time 4    Period Weeks    Status On-going      PT LONG TERM GOAL #4   Title Patient will demonstrate proper lifting mechanics to safely protect spine during lifting activities.    Baseline Met 05/29/20    Time 4    Period Weeks    Status Achieved                 Plan - 06/05/20 5409    Clinical Impression Statement Pt arrived today doing fairly well with mainly soreness in LB. Rx focused on core strengthening/ activation as well as bodymechanics and stretching. Pt 8 weeks post-op 06-04-20    Personal Factors and Comorbidities Comorbidity 2    Comorbidities L4-L5 lumbar discectomy 04/09/2020; HTN    Stability/Clinical Decision Making Stable/Uncomplicated    Rehab Potential Good    PT Frequency 2x / week    PT Duration 4 weeks    PT Treatment/Interventions ADLs/Self Care Home Management;Cryotherapy;Electrical Stimulation;Moist Heat;Gait training;Stair training;Functional mobility training;Therapeutic activities;Therapeutic exercise;Neuromuscular re-education;Balance training;Manual techniques;Passive range of  motion;Patient/family education    PT Next Visit Plan nustep or bike; core strengthening and stability, supine LE strengthening and stretching; modalities PRN for pain relief.    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

## 2020-06-07 ENCOUNTER — Other Ambulatory Visit: Payer: Self-pay | Admitting: Cardiology

## 2020-06-07 DIAGNOSIS — E782 Mixed hyperlipidemia: Secondary | ICD-10-CM

## 2020-06-09 ENCOUNTER — Other Ambulatory Visit: Payer: Self-pay

## 2020-06-09 ENCOUNTER — Ambulatory Visit: Payer: PPO | Admitting: Physical Therapy

## 2020-06-09 ENCOUNTER — Encounter: Payer: Self-pay | Admitting: Physical Therapy

## 2020-06-09 DIAGNOSIS — M5442 Lumbago with sciatica, left side: Secondary | ICD-10-CM | POA: Diagnosis not present

## 2020-06-09 DIAGNOSIS — M6281 Muscle weakness (generalized): Secondary | ICD-10-CM

## 2020-06-09 DIAGNOSIS — R293 Abnormal posture: Secondary | ICD-10-CM

## 2020-06-09 NOTE — Therapy (Signed)
[facility] [place], [place], [address] Phone: [phone]   Fax:  [phone]  Physical Therapy Treatment  Patient Details  Name: Russell Odom MRN: [id number] Date of Birth: 1956-05-07 Referring Provider (PT): Melina Schools, MD   Encounter Date: 06/09/2020   PT End of Session - 06/09/20 4259    Visit Number 6    Number of Visits 8    Date for PT Re-Evaluation 06/25/20    Authorization Type FOTO (IE: 40% limitation) ; progress note every 10th visit    PT Start Time 0815    PT Stop Time 0856    PT Time Calculation (min) 41 min    Activity Tolerance Patient tolerated treatment well    Behavior During Therapy [facility] for tasks assessed/performed           Past Medical History:  Diagnosis Date  . Arthritis, gouty   . 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 History of esophageal dilatation   . History of kidney stones   . HTN (hypertension)   . Hypothyroid   . Mixed hyperlipidemia     Past Surgical History:  Procedure Laterality Date  . BIOPSY  02/01/2018   Procedure: BIOPSY;  Surgeon: Daneil Dolin, MD;  [facility];  Service: Endoscopy;;  ileocecal valve  . COLONOSCOPY    07/04/2003   RMR: anal canal hemorrhoids, normal colon  . COLONOSCOPY  11/02/2012   Procedure: COLONOSCOPY;  Surgeon: Daneil Dolin, MD;  [facility];  Service: Endoscopy;  Laterality: N/A;  9:30  . COLONOSCOPY N/A 02/01/2018   Procedure: COLONOSCOPY;  Surgeon: Daneil Dolin, MD;  [facility];  Service: Endoscopy;  Laterality: N/A;  9:45  . ESOPHAGOGASTRODUODENOSCOPY  07/04/2003   RMR: Multiple linear esophageal erosions consistent with mild-to-moderate  erosive reflux esophagitis,  status post passage of a 44 F Maloney dilator/ Multiple antral and duodenal bulbar erosions; otherwise, the remainder of the stomach and the first and second portions of the duodenum appeared normal  . EXTRACORPOREAL SHOCK WAVE LITHOTRIPSY Right 11/13/2018   Procedure: EXTRACORPOREAL SHOCK WAVE LITHOTRIPSY (ESWL);  Surgeon: Lucas Mallow, MD;  [facility];  Service: Urology;  Laterality: Right;  . EXTRACORPOREAL SHOCK WAVE LITHOTRIPSY Right 01/29/2019   Procedure: EXTRACORPOREAL SHOCK WAVE LITHOTRIPSY (ESWL);  Surgeon: Lucas Mallow, MD;  [facility];  Service: Urology;  Laterality: Right;  . LUMBAR LAMINECTOMY/DECOMPRESSION MICRODISCECTOMY Left 04/09/2020   Procedure: Lumbar four through lumbar five Discectomy left;  Surgeon: Melina Schools, MD;  Location: [place];  Service: Orthopedics;  Laterality: Left;  2 hrs    There were no vitals filed for this visit.   Subjective Assessment - 06/09/20 0819    Subjective Covid-19 screening performed upon arrival. Pt arriving to [facility]pay reporting low back pain with sitting. Pt reporting it's been acting up all weekend.    Pertinent History L4-L5 Discectomy 04/09/2020; HTN    Limitations Sitting;House hold activities    Diagnostic tests MRI: confirmed ruptured disc    Patient Stated Goals return to playing golf    Currently in Pain? No/denies    Pain Location Back    Pain Orientation Lower    Pain Descriptors / Indicators Burning    Pain Onset  More than a month ago                             [facility] Adult PT Treatment/Exercise - 06/09/20 0001      Lumbar Exercises: Stretches   Active Hamstring Stretch 2 reps    Active Hamstring Stretch Limitations seated    Other Lumbar Stretch Exercise standing glute and hip flexor stretch 2x 30 secs each side. Standing with foot on mat.    Other Lumbar Stretch Exercise gastroc stretch on rocker board holding 30 seconds, x 2, cane in small of back wrapped upnder elbows working on  slow controlled rotation x 10 reps      Lumbar Exercises: Aerobic   Nustep L6 x11 min      Lumbar Exercises: Machines for Strengthening   Other Lumbar Machine Exercise Apollo: wood chop, x 15 reps each direction, chest press standing (all with 4 plates)      Lumbar Exercises: Standing   Shoulder Extension Strengthening;Both;20 reps;10 reps    Shoulder Extension Limitations blue XTS    Other Standing Lumbar Exercises opposite arm to knee in stancing working on core activation and posture control, marching bringing knees to 90 degrees x 20 reps    Other Standing Lumbar Exercises standing lunges x 15 working on core strengthen and core activation, standing hip abduction with green theraband x 20 each LE      Lumbar Exercises: Seated   Sit to Stand 15 reps    Other Seated Lumbar Exercises seated extension    Other Seated Lumbar Exercises seated clams with green theraband x 20 reps                  PT Education - 06/09/20 0821    Education Details exercise techniques and posture correction    Person(s) Educated Patient    Methods Explanation;Demonstration    Comprehension Verbalized understanding;Returned demonstration            PT Short Term Goals - 05/21/20 1210      PT SHORT TERM GOAL #1   Title STG=LTG             PT Long Term Goals - 05/29/20 0910      PT LONG TERM GOAL #1   Title Patient will be independent with HEP and progression    Time 4    Period Weeks    Status On-going      PT LONG TERM GOAL #2   Title Patient will report ability to sit for 30+ minutes with 3/10 low back pain during driving.    Time 4    Period Weeks    Status On-going      PT LONG TERM GOAL #3   Title Patient will report ability to perform ADLs and home activities  with low back pain and lower leg pain less than or equal to 3/10.    Time 4    Period Weeks    Status On-going      PT LONG TERM GOAL #4   Title Patient will demonstrate proper lifting mechanics to safely  protect spine during lifting activities.    Baseline Met 05/29/20    Time 4    Period Weeks    Status Achieved                 Plan - 06/09/20 4174    Clinical Impression Statement Pt arriving today reporting midline low back pain  which is worse with sitting. Pt compliant with his HEP. Pt tolerating exercises well with no reports of increased pain. Instructions on exercise technique and posture. Continue skilled PT.    Personal Factors and Comorbidities Comorbidity 2    Comorbidities L4-L5 lumbar discectomy 04/09/2020; HTN    Examination-Activity Limitations Sit    Examination-Participation Restrictions Driving    Stability/Clinical Decision Making Stable/Uncomplicated    Rehab Potential Good    PT Frequency 2x / week    PT Duration 4 weeks    PT Treatment/Interventions ADLs/Self Care Home Management;Cryotherapy;Electrical Stimulation;Moist Heat;Gait training;Stair training;Functional mobility training;Therapeutic activities;Therapeutic exercise;Neuromuscular re-education;Balance training;Manual techniques;Passive range of motion;Patient/family education    PT Next Visit Plan nustep or bike; core strengthening and stability, supine LE strengthening and stretching; modalities PRN for pain relief.    PT Home Exercise Plan see patient education section    Consulted and Agree with Plan of Care Patient           Patient will benefit from skilled therapeutic intervention in order to improve the following deficits and impairments:  Decreased activity tolerance, Decreased strength, Postural dysfunction, Pain, Decreased scar mobility, Increased edema, Decreased range of motion  Visit Diagnosis: Acute low back pain with left-sided sciatica, unspecified back pain laterality  Muscle weakness (generalized)  Abnormal posture     Problem List Patient Active Problem List   Diagnosis Date Noted  . 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  Hematochezia 10/19/2012  . Family history of colon cancer 10/19/2012  . UNSPECIFIED HYPOTHYROIDISM 06/21/2009  . 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 HYPERLIPIDEMIA, MIXED 06/21/2009  . GOUT 06/21/2009  . Essential hypertension 06/21/2009    Oretha Caprice, PT, MPT 06/09/2020, 9:03 AM  [facility] [address] [place], [place], [address] Phone: [phone]   Fax:  [phone]  Name: Russell Odom MRN: [id number] Date of Birth: 03-03-1956

## 2020-06-12 ENCOUNTER — Ambulatory Visit: Payer: PPO | Attending: Orthopedic Surgery | Admitting: Physical Therapy

## 2020-06-12 ENCOUNTER — Other Ambulatory Visit: Payer: Self-pay

## 2020-06-12 DIAGNOSIS — R293 Abnormal posture: Secondary | ICD-10-CM | POA: Diagnosis not present

## 2020-06-12 DIAGNOSIS — M6281 Muscle weakness (generalized): Secondary | ICD-10-CM | POA: Diagnosis not present

## 2020-06-12 DIAGNOSIS — M5442 Lumbago with sciatica, left side: Secondary | ICD-10-CM | POA: Insufficient documentation

## 2020-06-12 NOTE — Patient Instructions (Signed)
°  Scapular Retraction: Bilateral  Facing anchor, pull arms back, bringing shoulder blades together. Repeat _30___ times per set. Do __2-3__ sets per session. Do _2___ sessions per day.    Standing lat pull with theraband  Anchor bands higher than your head. Start with your arms straight out in front of you at shoulder height (or a little above).  Pull bands down next to your body and then slowly return to the starting position.  10-30 x1day    Bridging with Theraband  Begin by lying on your back with your knees bent and theraband around your knees. Tighten your abs by tilting your pelvis up and flattening your back on the mat. Squeeze your glutes and raise your hips off of the table towards the ceiling. Keeping your hips raised, pull your knees outward against the band. Relax your knees back to midline and slowly return your hips to the mat. Relax and Breathe 2x10 1x daily

## 2020-06-12 NOTE — Therapy (Signed)
[facility] [place], [place], [address] Phone: [phone]   Fax:  [phone]  Physical Therapy Treatment Discharge  Patient Details  Name: Russell Odom MRN: [id number] Date of Birth: [birth year] Referring Provider (PT): Melina Schools, MD   Encounter Date: 06/12/2020   PT End of Session - 06/12/20 0831    Visit Number 7    Number of Visits 8    Date for PT Re-Evaluation 06/25/20    Authorization Type FOTO 7th visit 6% ; progress note every 10th visit    PT Start Time 0815    PT Stop Time 0857    PT Time Calculation (min) 42 min    Activity Tolerance Patient tolerated treatment well    Behavior During Therapy [facility] for tasks assessed/performed           Past Medical History:  Diagnosis Date  . Arthritis, gouty   . 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 History of esophageal dilatation   . History of kidney stones   . HTN (hypertension)   . Hypothyroid   . Mixed hyperlipidemia     Past Surgical History:  Procedure Laterality Date  . BIOPSY  02/01/2018   Procedure: BIOPSY;  Surgeon: Daneil Dolin, MD;  [facility];  Service: Endoscopy;;  ileocecal valve  . COLONOSCOPY    07/04/2003   RMR: anal canal hemorrhoids, normal colon  . COLONOSCOPY  11/02/2012   Procedure: COLONOSCOPY;  Surgeon: Daneil Dolin, MD;  [facility];  Service: Endoscopy;  Laterality: N/A;  9:30  . COLONOSCOPY N/A 02/01/2018   Procedure: COLONOSCOPY;  Surgeon: Daneil Dolin, MD;  [facility];  Service: Endoscopy;  Laterality: N/A;  9:45  . ESOPHAGOGASTRODUODENOSCOPY  07/04/2003   RMR: Multiple linear esophageal erosions consistent with mild-to-moderate  erosive reflux esophagitis,  status post passage of a 52 F Maloney dilator/ Multiple antral and duodenal bulbar erosions; otherwise, the remainder of the stomach and the first and second portions of the duodenum appeared normal  . EXTRACORPOREAL SHOCK WAVE LITHOTRIPSY Right 11/13/2018   Procedure: EXTRACORPOREAL SHOCK WAVE LITHOTRIPSY (ESWL);  Surgeon: Lucas Mallow, MD;  [facility];  Service: Urology;  Laterality: Right;  . EXTRACORPOREAL SHOCK WAVE LITHOTRIPSY Right 01/29/2019   Procedure: EXTRACORPOREAL SHOCK WAVE LITHOTRIPSY (ESWL);  Surgeon: Lucas Mallow, MD;  [facility];  Service: Urology;  Laterality: Right;  . LUMBAR LAMINECTOMY/DECOMPRESSION MICRODISCECTOMY Left 04/09/2020   Procedure: Lumbar four through lumbar five Discectomy left;  Surgeon: Melina Schools, MD;  Location: [place];  Service: Orthopedics;  Laterality: Left;  2 hrs    There were no vitals filed for this visit.   Subjective Assessment - 06/12/20 0817    Subjective Covid-19 screening performed upon arrival. Patint reported minimal discomfort when he sits too long, no pain upon arrival    Pertinent History L4-L5 Discectomy 04/09/2020; HTN    Limitations Sitting;House hold activities    Diagnostic tests MRI: confirmed ruptured disc    Patient Stated Goals return to playing golf    Currently in Pain? No/denies                             [facility]  Adult PT Treatment/Exercise - 06/12/20 0001      Lumbar Exercises: Aerobic   Nustep L6 x11 min      Lumbar Exercises: Standing   Row Strengthening;Both;20 reps;10 reps;Theraband    Theraband Level (Row) Level 3 (Green)    Shoulder Extension Strengthening;Both;20 reps;10 reps;Theraband    Theraband Level (Shoulder Extension) Level 3 (Green)    Other Standing Lumbar Exercises diagnols green band x30 each      Lumbar Exercises: Seated   Sit to Stand 20 reps      Lumbar Exercises: Supine   Bridge with clamshell 20 reps   green band     Lumbar Exercises: Sidelying    Hip Abduction Both;20 reps;2 seconds      Lumbar Exercises: Prone   Straight Leg Raise 20 reps;2 seconds                  PT Education - 06/12/20 0840    Education Details HEP progression with green band    Person(s) Educated Patient    Methods Explanation;Demonstration;Handout    Comprehension Verbalized understanding;Returned demonstration            PT Short Term Goals - 05/21/20 1210      PT SHORT TERM GOAL #1   Title STG=LTG             PT Long Term Goals - 06/12/20 0830      PT LONG TERM GOAL #1   Title Patient will be independent with HEP and progression    Baseline met 06/12/20 issued today    Time 4    Period Weeks    Status Achieved      PT LONG TERM GOAL #2   Title Patient will report ability to sit for 30+ minutes with 3/10 low back pain during driving.    Baseline 15 min 06/12/20    Time 4    Period Weeks    Status Not Met      PT LONG TERM GOAL #3   Title Patient will report ability to perform ADLs and home activities  with low back pain and lower leg pain less than or equal to 3/10.    Baseline Met 06/12/20    Time 4    Period Weeks    Status Achieved      PT LONG TERM GOAL #4   Title Patient will demonstrate proper lifting mechanics to safely protect spine during lifting activities.    Baseline Met 05/29/20    Time 4    Period Weeks    Status Achieved                 Plan - 06/12/20 0854    Clinical Impression Statement Patient met all current goals except prolong sitting was impoved yet limited to 15 min. HEP provided for home progression. Patient idependent with ADL's and HEP.    Personal Factors and Comorbidities Comorbidity 2    Comorbidities L4-L5 lumbar discectomy 04/09/2020; HTN    Examination-Activity Limitations Sit    Examination-Participation Restrictions Driving    Stability/Clinical Decision Making Stable/Uncomplicated    Rehab Potential Good    PT Frequency 2x / week    PT Duration 4 weeks    PT  Treatment/Interventions ADLs/Self Care Home Management;Cryotherapy;Electrical Stimulation;Moist Heat;Gait training;Stair training;Functional mobility training;Therapeutic activities;Therapeutic exercise;Neuromuscular re-education;Balance training;Manual techniques;Passive range of motion;Patient/family education    PT Next Visit Plan DC    Consulted and Agree with Plan of Care Patient  Patient will benefit from skilled therapeutic intervention in order to improve the following deficits and impairments:  Decreased activity tolerance, Decreased strength, Postural dysfunction, Pain, Decreased scar mobility, Increased edema, Decreased range of motion  Visit Diagnosis: Acute low back pain with left-sided sciatica, unspecified back pain laterality  Muscle weakness (generalized)  Abnormal posture     Problem List Patient Active Problem List   Diagnosis Date Noted  . 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 Hematochezia 10/19/2012  . Family history of colon cancer 10/19/2012  . UNSPECIFIED HYPOTHYROIDISM 06/21/2009  . 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 HYPERLIPIDEMIA, MIXED 06/21/2009  . GOUT 06/21/2009  . Essential hypertension 06/21/2009    Ladean Raya, PTA 06/12/20 9:03 AM PHYSICAL THERAPY DISCHARGE SUMMARY  Visits from Start of Care: 7  Current functional level related to goals / functional outcomes: See above   Remaining deficits: 3/4 goals met See above  Education / Equipment: HEP  Plan: Patient agrees to discharge.  Patient goals were partially met. Patient is being discharged due to being pleased with the current functional level.  ?????     Kearney Hard, PT, MPT 06/13/20 12:33 PM       [facility] [place], [place], [address] Phone: [phone]   Fax:  [phone]  Name: Russell Odom MRN: [id number] Date of  Birth: 09-Jan-1956

## 2020-06-19 DIAGNOSIS — H25813 Combined forms of age-related cataract, bilateral: Secondary | ICD-10-CM | POA: Diagnosis not present

## 2020-06-19 DIAGNOSIS — D3131 Benign neoplasm of right choroid: Secondary | ICD-10-CM | POA: Diagnosis not present

## 2020-06-27 DIAGNOSIS — H25811 Combined forms of age-related cataract, right eye: Secondary | ICD-10-CM | POA: Diagnosis not present

## 2020-06-27 DIAGNOSIS — H52221 Regular astigmatism, right eye: Secondary | ICD-10-CM | POA: Diagnosis not present

## 2020-06-27 DIAGNOSIS — Z01818 Encounter for other preprocedural examination: Secondary | ICD-10-CM | POA: Diagnosis not present

## 2020-07-25 DIAGNOSIS — H2511 Age-related nuclear cataract, right eye: Secondary | ICD-10-CM | POA: Diagnosis not present

## 2020-07-25 DIAGNOSIS — H25811 Combined forms of age-related cataract, right eye: Secondary | ICD-10-CM | POA: Diagnosis not present

## 2020-08-08 DIAGNOSIS — M5126 Other intervertebral disc displacement, lumbar region: Secondary | ICD-10-CM | POA: Diagnosis not present

## 2020-08-08 DIAGNOSIS — E039 Hypothyroidism, unspecified: Secondary | ICD-10-CM | POA: Diagnosis not present

## 2020-08-08 DIAGNOSIS — M659 Synovitis and tenosynovitis, unspecified: Secondary | ICD-10-CM | POA: Diagnosis not present

## 2020-08-08 DIAGNOSIS — E7800 Pure hypercholesterolemia, unspecified: Secondary | ICD-10-CM | POA: Diagnosis not present

## 2020-08-08 DIAGNOSIS — E790 Hyperuricemia without signs of inflammatory arthritis and tophaceous disease: Secondary | ICD-10-CM | POA: Diagnosis not present

## 2020-08-08 DIAGNOSIS — R251 Tremor, unspecified: Secondary | ICD-10-CM | POA: Diagnosis not present

## 2020-08-08 DIAGNOSIS — Z800 Family history of malignant neoplasm of digestive organs: Secondary | ICD-10-CM | POA: Diagnosis not present

## 2020-08-08 DIAGNOSIS — I10 Essential (primary) hypertension: Secondary | ICD-10-CM | POA: Diagnosis not present

## 2020-09-09 ENCOUNTER — Other Ambulatory Visit: Payer: Self-pay | Admitting: Cardiology

## 2020-09-09 DIAGNOSIS — I10 Essential (primary) hypertension: Secondary | ICD-10-CM

## 2020-11-07 DIAGNOSIS — E790 Hyperuricemia without signs of inflammatory arthritis and tophaceous disease: Secondary | ICD-10-CM | POA: Diagnosis not present

## 2020-11-07 DIAGNOSIS — R251 Tremor, unspecified: Secondary | ICD-10-CM | POA: Diagnosis not present

## 2020-11-07 DIAGNOSIS — I10 Essential (primary) hypertension: Secondary | ICD-10-CM | POA: Diagnosis not present

## 2020-11-07 DIAGNOSIS — E7800 Pure hypercholesterolemia, unspecified: Secondary | ICD-10-CM | POA: Diagnosis not present

## 2020-11-07 DIAGNOSIS — Z800 Family history of malignant neoplasm of digestive organs: Secondary | ICD-10-CM | POA: Diagnosis not present

## 2020-11-07 DIAGNOSIS — E039 Hypothyroidism, unspecified: Secondary | ICD-10-CM | POA: Diagnosis not present

## 2020-11-07 DIAGNOSIS — M5126 Other intervertebral disc displacement, lumbar region: Secondary | ICD-10-CM | POA: Diagnosis not present

## 2020-11-07 DIAGNOSIS — M659 Synovitis and tenosynovitis, unspecified: Secondary | ICD-10-CM | POA: Diagnosis not present

## 2020-11-12 DIAGNOSIS — Z8739 Personal history of other diseases of the musculoskeletal system and connective tissue: Secondary | ICD-10-CM | POA: Diagnosis not present

## 2020-11-12 DIAGNOSIS — M79641 Pain in right hand: Secondary | ICD-10-CM | POA: Diagnosis not present

## 2020-11-12 DIAGNOSIS — M79642 Pain in left hand: Secondary | ICD-10-CM | POA: Diagnosis not present

## 2020-11-12 DIAGNOSIS — E7800 Pure hypercholesterolemia, unspecified: Secondary | ICD-10-CM | POA: Diagnosis not present

## 2020-11-12 DIAGNOSIS — M79643 Pain in unspecified hand: Secondary | ICD-10-CM | POA: Diagnosis not present

## 2020-11-12 DIAGNOSIS — M6530 Trigger finger, unspecified finger: Secondary | ICD-10-CM | POA: Diagnosis not present

## 2020-11-12 DIAGNOSIS — M1990 Unspecified osteoarthritis, unspecified site: Secondary | ICD-10-CM | POA: Diagnosis not present

## 2020-11-12 DIAGNOSIS — M779 Enthesopathy, unspecified: Secondary | ICD-10-CM | POA: Diagnosis not present

## 2021-02-10 DIAGNOSIS — Z5181 Encounter for therapeutic drug level monitoring: Secondary | ICD-10-CM | POA: Diagnosis not present

## 2021-02-10 DIAGNOSIS — E7800 Pure hypercholesterolemia, unspecified: Secondary | ICD-10-CM | POA: Diagnosis not present

## 2021-02-10 DIAGNOSIS — E039 Hypothyroidism, unspecified: Secondary | ICD-10-CM | POA: Diagnosis not present

## 2021-02-10 DIAGNOSIS — E790 Hyperuricemia without signs of inflammatory arthritis and tophaceous disease: Secondary | ICD-10-CM | POA: Diagnosis not present

## 2021-02-10 DIAGNOSIS — Z800 Family history of malignant neoplasm of digestive organs: Secondary | ICD-10-CM | POA: Diagnosis not present

## 2021-02-10 DIAGNOSIS — I10 Essential (primary) hypertension: Secondary | ICD-10-CM | POA: Diagnosis not present

## 2021-04-02 DIAGNOSIS — R059 Cough, unspecified: Secondary | ICD-10-CM | POA: Diagnosis not present

## 2021-04-06 ENCOUNTER — Telehealth: Payer: Self-pay

## 2021-04-06 NOTE — Telephone Encounter (Signed)
Contacted patient regarding orders received for MAB infusion.  Patient stated that he is feeling better today.  He declined the infusion at this time.

## 2021-04-25 IMAGING — CR PORTABLE CHEST - 1 VIEW
2 series · 2 of 2 positions shown · non-contrast
Comparison: None.

CLINICAL DATA: Sepsis and fever

EXAM:
PORTABLE CHEST 1 VIEW

[portable (1 of 2)]
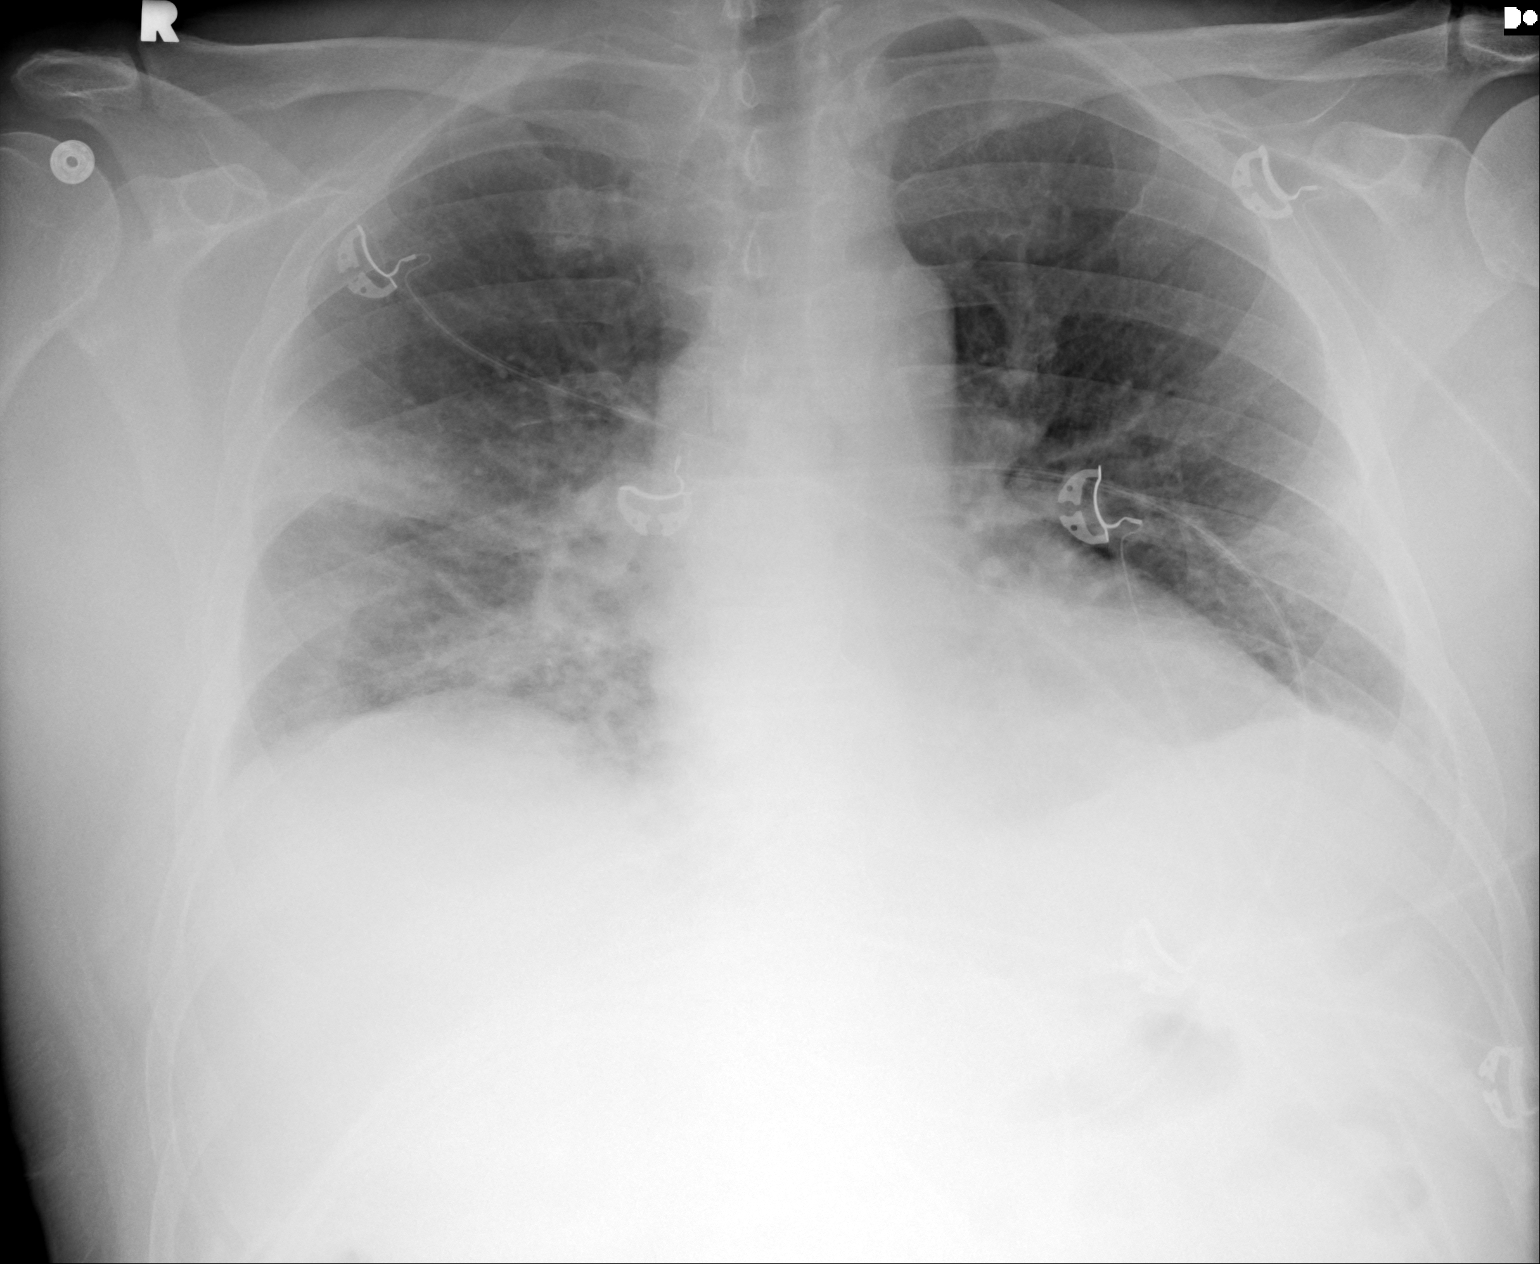

[portable (2 of 2)]
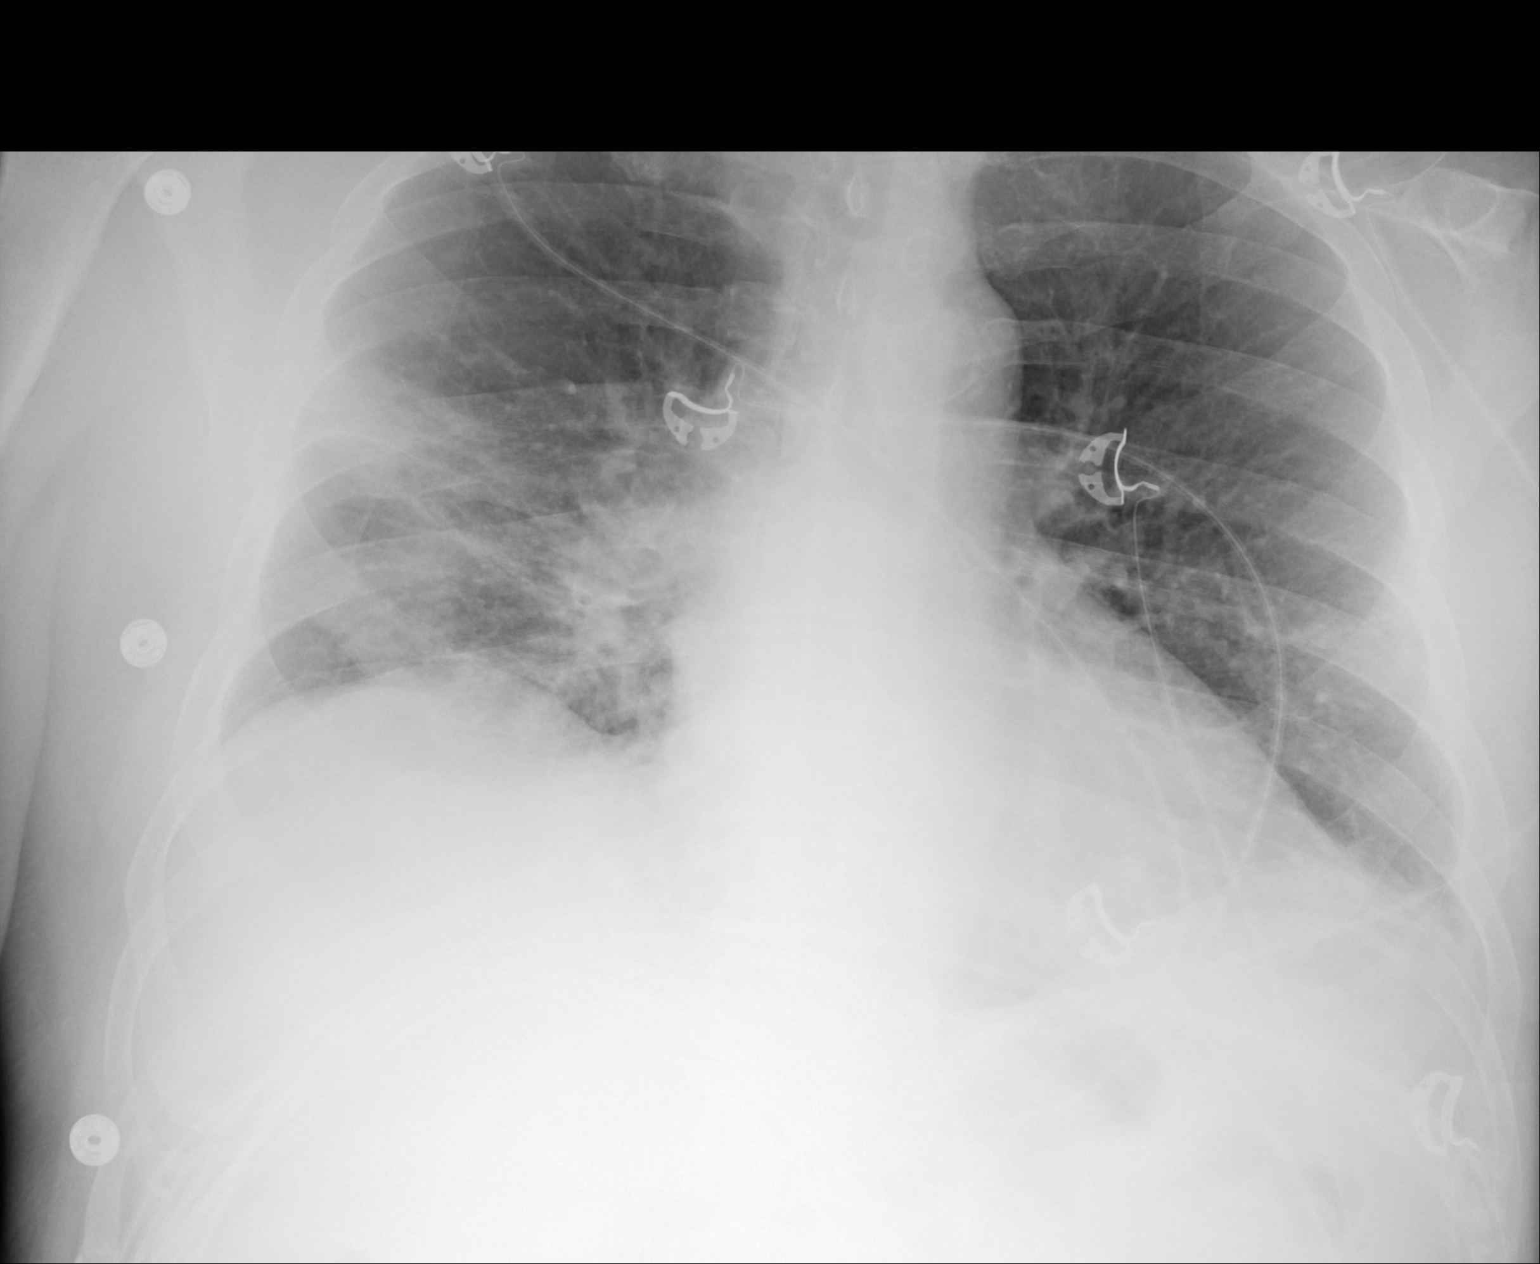

[2 of 2 positions shown; findings below may reference images not displayed]

FINDINGS: Mildly low volume film.

Airspace opacities within the RIGHT mid and lower lung noted,
suspicious for pneumonia.

Minimal LEFT basilar atelectasis noted.

Cardiomediastinal silhouette is unremarkable.

No definite pleural effusion or pneumothorax.

No acute bony abnormalities identified.
IMPRESSION: RIGHT mid and lower lung airspace opacities likely representing
pneumonia.

## 2021-06-06 ENCOUNTER — Other Ambulatory Visit: Payer: Self-pay | Admitting: Cardiology

## 2021-06-06 DIAGNOSIS — E782 Mixed hyperlipidemia: Secondary | ICD-10-CM

## 2021-08-18 DIAGNOSIS — E7800 Pure hypercholesterolemia, unspecified: Secondary | ICD-10-CM | POA: Diagnosis not present

## 2021-08-18 DIAGNOSIS — E790 Hyperuricemia without signs of inflammatory arthritis and tophaceous disease: Secondary | ICD-10-CM | POA: Diagnosis not present

## 2021-08-18 DIAGNOSIS — Z800 Family history of malignant neoplasm of digestive organs: Secondary | ICD-10-CM | POA: Diagnosis not present

## 2021-08-18 DIAGNOSIS — Z23 Encounter for immunization: Secondary | ICD-10-CM | POA: Diagnosis not present

## 2021-08-18 DIAGNOSIS — Z5181 Encounter for therapeutic drug level monitoring: Secondary | ICD-10-CM | POA: Diagnosis not present

## 2021-08-18 DIAGNOSIS — E039 Hypothyroidism, unspecified: Secondary | ICD-10-CM | POA: Diagnosis not present

## 2021-08-18 DIAGNOSIS — I10 Essential (primary) hypertension: Secondary | ICD-10-CM | POA: Diagnosis not present

## 2022-08-10 ENCOUNTER — Emergency Department (HOSPITAL_BASED_OUTPATIENT_CLINIC_OR_DEPARTMENT_OTHER): Payer: PPO

## 2022-08-10 ENCOUNTER — Encounter (HOSPITAL_BASED_OUTPATIENT_CLINIC_OR_DEPARTMENT_OTHER): Payer: Self-pay

## 2022-08-10 ENCOUNTER — Emergency Department (HOSPITAL_BASED_OUTPATIENT_CLINIC_OR_DEPARTMENT_OTHER)
Admission: EM | Admit: 2022-08-10 | Discharge: 2022-08-10 | Disposition: A | Discharge status: Home / Self Care | Payer: PPO | Attending: Emergency Medicine | Admitting: Emergency Medicine

## 2022-08-10 ENCOUNTER — Other Ambulatory Visit: Payer: Self-pay

## 2022-08-10 DIAGNOSIS — R1032 Left lower quadrant pain: Secondary | ICD-10-CM | POA: Diagnosis present

## 2022-08-10 DIAGNOSIS — N23 Unspecified renal colic: Secondary | ICD-10-CM | POA: Diagnosis not present

## 2022-08-10 LAB — URINALYSIS, ROUTINE W REFLEX MICROSCOPIC
Bilirubin Urine: NEGATIVE
Glucose, UA: NEGATIVE mg/dL
Ketones, ur: NEGATIVE mg/dL
Nitrite: NEGATIVE
RBC / HPF: >50 RBC/hpf — ABNORMAL HIGH (ref 0–5)
Specific Gravity, Urine: 1.023 (ref 1.005–1.030)
pH: 5.5 (ref 5.0–8.0)

## 2022-08-10 LAB — CBC
HCT: 46.5 % (ref 39.0–52.0)
Hemoglobin: 15.9 g/dL (ref 13.0–17.0)
MCH: 31.1 pg (ref 26.0–34.0)
MCHC: 34.2 g/dL (ref 30.0–36.0)
MCV: 91 fL (ref 80.0–100.0)
Platelets: 209 10*3/uL (ref 150–400)
RBC: 5.11 MIL/uL (ref 4.22–5.81)
RDW: 13.7 % (ref 11.5–15.5)
WBC: 8.4 10*3/uL (ref 4.0–10.5)
nRBC: 0 % (ref 0.0–0.2)

## 2022-08-10 LAB — BASIC METABOLIC PANEL
Anion gap: 8 (ref 5–15)
BUN: 18 mg/dL (ref 8–23)
CO2: 24 mmol/L (ref 22–32)
Calcium: 9.8 mg/dL (ref 8.9–10.3)
Chloride: 106 mmol/L (ref 98–111)
Creatinine, Ser: 1.24 mg/dL (ref 0.61–1.24)
GFR, Estimated: >60 mL/min (ref >60)
Glucose, Bld: 106 mg/dL — ABNORMAL HIGH (ref 70–99)
Potassium: 4.2 mmol/L (ref 3.5–5.1)
Sodium: 138 mmol/L (ref 135–145)

## 2022-08-10 MED ORDER — TAMSULOSIN HCL 0.4 MG PO CAPS: 0.4000 mg | ORAL_CAPSULE | Freq: Two times a day (BID) | ORAL | 0 refills | Status: DC

## 2022-08-10 MED ORDER — KETOROLAC TROMETHAMINE 10 MG PO TABS: 5.0000 mg | ORAL_TABLET | Freq: Four times a day (QID) | ORAL | 0 refills | Status: DC | PRN

## 2022-08-10 MED ORDER — ONDANSETRON HCL 4 MG PO TABS: 4.0000 mg | ORAL_TABLET | Freq: Four times a day (QID) | ORAL | 0 refills | Status: DC

## 2022-08-10 MED ORDER — ONDANSETRON HCL 4 MG/2ML IJ SOLN
4.0000 mg | Freq: Once | INTRAMUSCULAR | Status: AC
  Administered 2022-08-10: 4 mg via INTRAVENOUS
  Filled 2022-08-10: qty 2

## 2022-08-10 MED ORDER — MORPHINE SULFATE (PF) 4 MG/ML IV SOLN
4.0000 mg | Freq: Once | INTRAVENOUS | Status: AC
  Administered 2022-08-10: 4 mg via INTRAVENOUS
  Filled 2022-08-10: qty 1

## 2022-08-10 MED ORDER — KETOROLAC TROMETHAMINE 30 MG/ML IJ SOLN
30.0000 mg | Freq: Once | INTRAMUSCULAR | Status: AC
  Administered 2022-08-10: 30 mg via INTRAVENOUS
  Filled 2022-08-10: qty 1

## 2022-08-10 MED ORDER — OXYCODONE HCL 5 MG PO TABS: 2.5000 mg | ORAL_TABLET | ORAL | 0 refills | Status: DC | PRN

## 2022-08-10 NOTE — Discharge Instructions (Addendum)
Contact a health care provider if: You have a fever or chills. Your urine smells bad or looks cloudy. You have pain or burning when you pass urine. Get help right away if: Your flank pain or groin pain suddenly worsens. You become confused or disoriented or you lose consciousness.

## 2022-08-10 NOTE — ED Provider Notes (Signed)
[facility] EMERGENCY DEPT Provider Note   CSN: [id number] Arrival date & time: 08/10/22  1205     History  Chief Complaint  Patient presents with   Flank Pain    Russell Odom is a 66 y.o. male.  Patient here with L flank pain. Onsety last night, improved and then recurred this morning. Pain is 7-10/10, aching, constant and colicky. Radiates between flank and LLQ. No urinary sxs. + nausea, no vomiting. Hx of R sided renal stones requiring lithotripsy.  The history is provided by the patient.  Flank Pain This is a recurrent problem. The current episode started yesterday. The problem occurs constantly. The problem has been gradually worsening. Pertinent negatives include no chest pain, no abdominal pain, no headaches and no shortness of breath. Nothing aggravates the symptoms. Nothing relieves the symptoms.       Home Medications Prior to Admission medications   Medication Sig Start Date End Date Taking? Authorizing Provider  allopurinol (ZYLOPRIM) 300 MG tablet Take 300 mg by mouth daily. 04/01/15  Yes [provider]  benazepril (LOTENSIN) 20 MG tablet TAKE 2 TABLETS (40 MG TOTAL) BY MOUTH 2 (TWO) TIMES DAILY. 09/09/20  Yes Adrian Prows, MD  dipyridamole-aspirin (AGGRENOX) 200-25 MG 12hr capsule Take 1 capsule by mouth 2 (two) times daily. 11/12/05  Yes [provider]  gabapentin (NEURONTIN) 300 MG capsule Take 300 mg by mouth daily as needed for pain. 03/18/20  Yes [provider]  icosapent Ethyl (VASCEPA) 1 g capsule Take 2 g by mouth 2 (two) times daily. 2 Capsule twice a day   Yes [provider]  ketorolac (TORADOL) 10 MG tablet Take 0.5-1 tablets (5-10 mg total) by mouth every 6 (six) hours as needed. 08/10/22  Yes Nayquan Evinger, PA-C  levothyroxine (SYNTHROID, LEVOTHROID) 50 MCG tablet Take 50 mcg by mouth daily before breakfast.    Yes [provider]  MEGARED OMEGA-3 KRILL OIL 500 MG CAPS Take 500 mg by mouth  at bedtime.   Yes [provider]  Multiple Vitamins-Minerals (MULTIVITAMINS THER. W/MINERALS) TABS Take 1 tablet by mouth daily.   Yes [provider]  ondansetron (ZOFRAN) 4 MG tablet Take 1 tablet (4 mg total) by mouth every 6 (six) hours. 08/10/22  Yes Doye Montilla, PA-C  oxyCODONE (ROXICODONE) 5 MG immediate release tablet Take 0.5-1 tablets (2.5-5 mg total) by mouth every 4 (four) hours as needed for severe pain. 08/10/22  Yes Forrester Blando, PA-C  rosuvastatin (CRESTOR) 20 MG tablet TAKE 1 TABLET BY MOUTH EVERY DAY 06/08/21  Yes Patwardhan, Manish J, MD  tamsulosin (FLOMAX) 0.4 MG CAPS capsule Take 0.4 mg by mouth daily.    Yes [provider]  tamsulosin (FLOMAX) 0.4 MG CAPS capsule Take 1 capsule (0.4 mg total) by mouth 2 (two) times daily. 08/10/22  Yes Eveleen Mcnear, PA-C  ondansetron (ZOFRAN) 4 MG tablet Take 1 tablet (4 mg total) by mouth every 8 (eight) hours as needed for nausea or vomiting. 04/09/20   Melina Schools, MD      Allergies    Patient has no known allergies.    Review of Systems   Review of Systems  Constitutional:  Negative for chills and fever.  Respiratory:  Negative for shortness of breath.   Cardiovascular:  Negative for chest pain.  Gastrointestinal:  Negative for abdominal pain.  Genitourinary:  Positive for flank pain. Negative for hematuria and urgency.  Neurological:  Negative for headaches.    Physical Exam Updated Vital Signs BP  126/81 (BP Location: Right Arm)   Pulse (!) 56   Temp 99 F (37.2 C) (Oral)   Resp 18   Ht '6\' 1"'$  (1.854 m)   Wt 104.3 kg   SpO2 94%   BMI 30.34 kg/m  Physical Exam Vitals and nursing note reviewed.  Constitutional:      General: He is not in acute distress.    Appearance: He is well-developed. He is not diaphoretic.  HENT:     Head: Normocephalic and atraumatic.  Eyes:     General: No scleral icterus.    Conjunctiva/sclera: Conjunctivae normal.  Cardiovascular:     Rate and  Rhythm: Normal rate and regular rhythm.     Heart sounds: Normal heart sounds.  Pulmonary:     Effort: Pulmonary effort is normal. No respiratory distress.     Breath sounds: Normal breath sounds.  Abdominal:     Palpations: Abdomen is soft.     Tenderness: There is no abdominal tenderness. There is no right CVA tenderness or left CVA tenderness.  Musculoskeletal:     Cervical back: Normal range of motion and neck supple.  Skin:    General: Skin is warm and dry.  Neurological:     Mental Status: He is alert.  Psychiatric:        Behavior: Behavior normal.     ED Results / Procedures / Treatments   Labs (all labs ordered are listed, but only abnormal results are displayed) Labs Reviewed  URINALYSIS, ROUTINE W REFLEX MICROSCOPIC - Abnormal; Notable for the following components:      Result Value   Hgb urine dipstick LARGE (*)    Protein, ur TRACE (*)    Leukocytes,Ua SMALL (*)    RBC / HPF >50 (*)    All other components within normal limits  BASIC METABOLIC PANEL - Abnormal; Notable for the following components:   Glucose, Bld 106 (*)    All other components within normal limits  CBC    EKG None  Radiology CT RENAL STONE STUDY  Result Date: 08/10/2022 CLINICAL DATA:  Flank pain.  Kidney stone suspected EXAM: CT ABDOMEN AND PELVIS WITHOUT CONTRAST TECHNIQUE: Multidetector CT imaging of the abdomen and pelvis was performed following the standard protocol without IV contrast. RADIATION DOSE REDUCTION: This exam was performed according to the departmental dose-optimization program which includes automated exposure control, adjustment of the mA and/or kV according to patient size and/or use of iterative reconstruction technique. COMPARISON:  CT 10/25/2018 FINDINGS: Lower chest: Lung bases are clear. Hepatobiliary: No focal hepatic lesion. No biliary duct dilatation. Common bile duct is normal. Pancreas: Pancreas is normal. No ductal dilatation. No pancreatic inflammation. Spleen:  Normal spleen Adrenals/urinary tract: Adrenal glands normal. Hydronephrosis and hydroureter on the LEFT related to an obstructing calculus in the mid LEFT ureter measuring 6 mm on image 51/2. This LEFT obstructing ureteral calculus is at the L5 vertebral body level. Two additional LEFT renal calculi measuring 4 mm and 1 mm. No RIGHT renal calculi. Stomach/Bowel: The stomach, duodenum, and small bowel normal. Partially calcified soft tissue mass along the mesenteric border of the descending colon measures 2.4 x 2.0 cm. This lesion is present on comparison CT measuring 2.4 x 2.1 cm (CT 10/25/2018). Interval calcification of the lesion. Rectosigmoid colon normal. Vascular/Lymphatic: Abdominal aorta is normal caliber with atherosclerotic calcification. There is no retroperitoneal or periportal lymphadenopathy. No pelvic lymphadenopathy. Reproductive: Unremarkable Other: No free fluid. Musculoskeletal: No aggressive osseous lesion. IMPRESSION: 1. Obstructing calculus in the mid  LEFT ureter. 2. LEFT nephrolithiasis. 3. Partially calcified soft tissue mass lesion along the descending colon unchanged in size from 2020. Favor calcified large colonic diverticulum. Electronically Signed   By: Suzy Bouchard M.D.   On: 08/10/2022 13:45    Procedures Procedures    Medications Ordered in ED Medications  morphine (PF) 4 MG/ML injection 4 mg (4 mg Intravenous Given 08/10/22 1348)  ondansetron (ZOFRAN) injection 4 mg (4 mg Intravenous Given 08/10/22 1347)  ketorolac (TORADOL) 30 MG/ML injection 30 mg (30 mg Intravenous Given 08/10/22 1415)    ED Course/ Medical Decision Making/ A&P Clinical Course as of 08/10/22 1537  Tue Aug 10, 2022  1334 Creatinine: 1.24 [AH]  1334 Urinalysis, Routine w reflex microscopic(!) No evidence of infection [AH]    Clinical Course User Index [AH] Margarita Mail, PA-C                           Medical Decision Making This patient presents to the ED for concern of flank pain,  this involves an extensive number of treatment options, and is a complaint that carries with it a high risk of complications and morbidity.  The differential diagnosis of emergent flank pain includes, but is not limited to :Abdominal aortic aneurysm,, Renal artery embolism,Renal vein thrombosis, Aortic dissection, Mesenteric ischemia, Pyelonephritis, Renal infarction, Renal hemorrhage, Nephrolithiasis/ Renal Colic, Bladder tumor,Cystitis, Biliary colic, Pancreatitis Perforated peptic ulcer Appendicitis ,Inguinal Hernia, Diverticulitis, Bowel obstruction Shingles Lower lobe pneumonia, Retroperitoneal hematoma/abscess/tumor, Epidural abscess, Epidural hematoma       Co morbidities that complicate the patient evaluation       hx of gout and vascular disease   Additional history obtained:  {Additional history obtained from wife at bedside    Lab Tests:  I Ordered, and personally interpreted labs.  The pertinent results include:  normal renal function, no uti    Imaging Studies ordered:  I ordered imaging studies including ct renal stone study I independently visualized and interpreted imaging which showed 73m mid left ureteral stone I agree with the radiologist interpretation   Cardiac Monitoring/ECG:       The patient was maintained on a cardiac monitor.  I personally viewed and interpreted the cardiac monitored which showed an underlying rhythm of: nsr      Medicines ordered and prescription drug management:  I ordered medication including Medications morphine (PF) 4 MG/ML injection 4 mg (4 mg Intravenous Given 08/10/22 1348) ondansetron (ZOFRAN) injection 4 mg (4 mg Intravenous Given 08/10/22 1347) ketorolac (TORADOL) 30 MG/ML injection 30 mg (30 mg Intravenous Given 08/10/22 1415) for pain Reevaluation of the patient after these medicines showed that the patient resolved I have reviewed the patients home medicines and have made adjustments as needed   Test  Considered:       ct with contrast- doubt intrabdominal infection   Critical Interventions:       pain meds and fluids   Consultations Obtained:      Problem List / ED Course:       (NK59 Ureteral colic  (primary encounter diagnosis)     Reevaluation:  After the interventions noted above, I reevaluated the patient and found that they have :resolved   Social Determinants of Health:       has established urology    Dispostion:  After consideration of the diagnostic results and the patients response to treatment, I feel that the patent would benefit from discharge..    Amount and/or Complexity  of Data Reviewed Labs: ordered. Decision-making details documented in ED Course. Radiology: ordered.  Risk Prescription drug management.   The presentation NOT consistent with an infected stone, nephric abscess, sepsis, or renal failure.  Similarly, this presentation is NOT consistent with AAA; Mesenteric Ischemia; Bowel Perforation; Bowel Obstruction; Sigmoid Volvulus; Diverticulitis; Appendicitis; Peritonitis; Cholecystitis, ascending cholangitis or other gallbladder disease; perforated ulcer; significant GI bleeding, splenic rupture/infarction; Hepatic abscess; or other surgical/acute abdomen.  Similarly, this presentation is NOT consistent with ACS or Myocardial Ischemia; Pulmonary Embolism; fistula; incarcerated hernia; Pancreatitis, Aortic Dissection; Diabetic Ketoacidosis; Ischemic colitis; Psoas or other abscess; Methanol poisoning; Heavy metal toxicity; or porphyria.  Similarly, this presentation is NOT consistent with acute coronary syndrome, pulmonary embolism, dissection, borhaave's, arrythmia, pneumothorax, cardiac tamponade, or other emergent cardiopulmonary condition.  Similarly, this presentation is NOT consistent with pyelonephritis, urinary infection, pneumonia, or other focal bacterial infection.  Moreover this case is NOT consistent with testicular  torsion, prostatitis, hernia, STI, or other testicular issue.  Strict return and follow-up precautions have been given by me personally or by detailed written instruction verbalized by nursing staff using the teach back method. to the patient/family/caregiver(s).  Data Reviewed/Counseling: I have reviwed the patient's vital signs, nursing notes, and other relevant tests/information. I had a detailed discussion regarding the historical points, exam findings, and any diagnostic results supporting the discharge diagnosis. I also discussed the need for outpatient follow-up and the need to return to the ED if symptoms worsen or if there are any questions or concerns that arise at home.  Final Clinical Impression(s) / ED Diagnoses Final diagnoses:  Ureteral colic    Rx / DC Orders ED Discharge Orders          Ordered    oxyCODONE (ROXICODONE) 5 MG immediate release tablet  Every 4 hours PRN        08/10/22 1426    ketorolac (TORADOL) 10 MG tablet  Every 6 hours PRN        08/10/22 1426    tamsulosin (FLOMAX) 0.4 MG CAPS capsule  2 times daily        08/10/22 1426    ondansetron (ZOFRAN) 4 MG tablet  Every 6 hours        08/10/22 1426              Margarita Mail, PA-C 08/10/22 1537    Pollina, Gwenyth Allegra, MD 08/15/22 [phone]

## 2022-08-10 NOTE — ED Triage Notes (Signed)
Presents with LLQ and left flank pain onset yesterday with diarrhea. Denies nausea. Denies gross blood in urine or stool. H/O kidney stones.

## 2022-08-24 ENCOUNTER — Telehealth: Payer: Self-pay

## 2022-08-24 NOTE — Telephone Encounter (Signed)
        Patient  visited [facility] on 10/31     Telephone encounter attempt :  1st  A HIPAA compliant voice message was left requesting a return call.  Instructed patient to call back    [facility]  [phone], [place], [place] [address] Phone: [phone] Email: [email or web address]

## 2022-12-20 ENCOUNTER — Encounter: Payer: Self-pay | Admitting: *Deleted

## 2022-12-29 DIAGNOSIS — G629 Polyneuropathy, unspecified: Secondary | ICD-10-CM | POA: Diagnosis not present

## 2022-12-29 DIAGNOSIS — E039 Hypothyroidism, unspecified: Secondary | ICD-10-CM | POA: Diagnosis not present

## 2022-12-29 DIAGNOSIS — E669 Obesity, unspecified: Secondary | ICD-10-CM | POA: Diagnosis not present

## 2022-12-29 DIAGNOSIS — Z8673 Personal history of transient ischemic attack (TIA), and cerebral infarction without residual deficits: Secondary | ICD-10-CM | POA: Diagnosis not present

## 2022-12-29 DIAGNOSIS — E785 Hyperlipidemia, unspecified: Secondary | ICD-10-CM | POA: Diagnosis not present

## 2022-12-29 DIAGNOSIS — I10 Essential (primary) hypertension: Secondary | ICD-10-CM | POA: Diagnosis not present

## 2022-12-29 DIAGNOSIS — Z9849 Cataract extraction status, unspecified eye: Secondary | ICD-10-CM | POA: Diagnosis not present

## 2022-12-29 DIAGNOSIS — H259 Unspecified age-related cataract: Secondary | ICD-10-CM | POA: Diagnosis not present

## 2022-12-29 DIAGNOSIS — G8929 Other chronic pain: Secondary | ICD-10-CM | POA: Diagnosis not present

## 2023-01-04 ENCOUNTER — Telehealth (INDEPENDENT_AMBULATORY_CARE_PROVIDER_SITE_OTHER): Payer: Self-pay | Admitting: *Deleted

## 2023-01-04 NOTE — Telephone Encounter (Signed)
  Procedure: Colonoscopy  Height: 6'1 Weight: 250lbs       Have you had a colonoscopy before?  02/01/18, Dr. Gala Romney  Do you have family history of colon cancer?  Yes sister  Do you have a family history of polyps? no  Previous colonoscopy with polyps removed? no  Do you have a history colorectal cancer?   no  Are you diabetic?  no  Do you have a prosthetic or mechanical heart valve? no  Do you have a pacemaker/defibrillator?   no  Have you had endocarditis/atrial fibrillation?  no  Do you use supplemental oxygen/CPAP?  no  Have you had joint replacement within the last 12 months?  no  Do you tend to be constipated or have to use laxatives?  no   Do you have history of alcohol use? If yes, how much and how often.  no  Do you have history or are you using drugs? If yes, what do are you  using?  no  Have you ever had a stroke/heart attack?  Stroke 16 years ago  Have you ever had a heart or other vascular stent placed,?no  Do you take weight loss medication? no  Do you take any blood-thinning medications such as: (Plavix, aspirin, Coumadin, Aggrenox, Brilinta, Xarelto, Eliquis, Pradaxa, Savaysa or Effient)? Yes aggrenox  If yes we need the name, milligram, dosage and who is prescribing doctor:  Lurline Del             Current Outpatient Medications  Medication Sig Dispense Refill   benazepril (LOTENSIN) 20 MG tablet TAKE 2 TABLETS (40 MG TOTAL) BY MOUTH 2 (TWO) TIMES DAILY. 180 tablet 0   dipyridamole-aspirin (AGGRENOX) 200-25 MG 12hr capsule Take 1 capsule by mouth 2 (two) times daily.     MEGARED OMEGA-3 KRILL OIL 500 MG CAPS Take 500 mg by mouth at bedtime.     Multiple Vitamins-Minerals (MULTIVITAMINS THER. W/MINERALS) TABS Take 1 tablet by mouth daily.     rosuvastatin (CRESTOR) 20 MG tablet TAKE 1 TABLET BY MOUTH EVERY DAY 90 tablet 3   gabapentin (NEURONTIN) 300 MG capsule Take 300 mg by mouth daily as needed for pain. (Patient not taking: Reported on 01/04/2023)      No current facility-administered medications for this visit.    No Known Allergies

## 2023-01-24 NOTE — Telephone Encounter (Signed)
LMOVM to call back 

## 2023-01-24 NOTE — Telephone Encounter (Signed)
ASA 3 

## 2023-01-25 MED ORDER — PEG 3350-KCL-NA BICARB-NACL 420 G PO SOLR: 4000.0000 mL | Freq: Once | ORAL | 0 refills | Status: AC

## 2023-01-25 NOTE — Telephone Encounter (Signed)
Pt called back. Scheduled for 5/1. Aware will send instructions. Will call back with pre-op appt. Rx for prep sent to pharmacy.

## 2023-01-25 NOTE — Addendum Note (Signed)
Addended by: Armstead Peaks on: 01/25/2023 02:00 PM   Modules accepted: Orders

## 2023-01-25 NOTE — Telephone Encounter (Signed)
Called pt and he is aware of his pre-op appt.  

## 2023-01-26 NOTE — Telephone Encounter (Signed)
Questionnaire from recall, no referral needed  

## 2023-02-03 NOTE — Patient Instructions (Signed)
Russell Odom  02/03/2023     @   Your procedure is scheduled on  02/09/2023.   Report to [facility] at  0815  A.M.   Call this number if you have problems the morning of surgery:  [phone]  If you experience any cold or flu symptoms such as cough, fever, chills, shortness of breath, etc. between now and your scheduled surgery, please notify us at the above number.   Remember:  Follow the diet and prep instructions given to you by the office.     Take these medicines the morning of surgery with A SIP OF WATER                                        levothyroxine.     Do not wear jewelry, make-up or nail polish.  Do not wear lotions, powders, or perfumes, or deodorant.  Do not shave 48 hours prior to surgery.  Men may shave face and neck.  Do not bring valuables to the hospital.  [facility] is not responsible for any belongings or valuables.  Contacts, dentures or bridgework may not be worn into surgery.  Leave your suitcase in the car.  After surgery it may be brought to your room.  For patients admitted to the hospital, discharge time will be determined by your treatment team.  Patients discharged the day of surgery will not be allowed to drive home and must have someone with them for 24 hours.    Special instructions:   DO NOT smoke tobacco or vape for 24 hours before your procedure.  Please read over the following fact sheets that you were given. Anesthesia Post-op Instructions and Care and Recovery After Surgery      Colonoscopy, Adult, Care After The following information offers guidance on how to care for yourself after your procedure. Your health care provider may also give you more specific instructions. If you have problems or questions, contact your health care provider. What can I expect after the procedure? After the procedure, it is common to have: A small amount of blood in your stool for 24 hours after the  procedure. Some gas. Mild cramping or bloating of your abdomen. Follow these instructions at home: Eating and drinking  Drink enough fluid to keep your urine pale yellow. Follow instructions from your health care provider about eating or drinking restrictions. Resume your normal diet as told by your health care provider. Avoid heavy or fried foods that are hard to digest. Activity Rest as told by your health care provider. Avoid sitting for a long time without moving. Get up to take short walks every 1-2 hours. This is important to improve blood flow and breathing. Ask for help if you feel weak or unsteady. Return to your normal activities as told by your health care provider. Ask your health care provider what activities are safe for you. Managing cramping and bloating  Try walking around when you have cramps or feel bloated. If directed, apply heat to your abdomen as told by your health care provider. Use the heat source that your health care provider recommends, such as a moist heat pack or a heating pad. Place a towel between your skin and the heat source. Leave the heat on for 20-30 minutes. Remove the heat if your skin turns bright red. This is especially important if  you are unable to feel pain, heat, or cold. You have a greater risk of getting burned. General instructions If you were given a sedative during the procedure, it can affect you for several hours. Do not drive or operate machinery until your health care provider says that it is safe. For the first 24 hours after the procedure: Do not sign important documents. Do not drink alcohol. Do your regular daily activities at a slower pace than normal. Eat soft foods that are easy to digest. Take over-the-counter and prescription medicines only as told by your health care provider. Keep all follow-up visits. This is important. Contact a health care provider if: You have blood in your stool 2-3 days after the procedure. Get  help right away if: You have more than a small spotting of blood in your stool. You have large blood clots in your stool. You have swelling of your abdomen. You have nausea or vomiting. You have a fever. You have increasing pain in your abdomen that is not relieved with medicine. These symptoms may be an emergency. Get help right away. Call 911. Do not wait to see if the symptoms will go away. Do not drive yourself to the hospital. Summary After the procedure, it is common to have a small amount of blood in your stool. You may also have mild cramping and bloating of your abdomen. If you were given a sedative during the procedure, it can affect you for several hours. Do not drive or operate machinery until your health care provider says that it is safe. Get help right away if you have a lot of blood in your stool, nausea or vomiting, a fever, or increased pain in your abdomen. This information is not intended to replace advice given to you by your health care provider. Make sure you discuss any questions you have with your health care provider. Document Revised: 05/20/2021 Document Reviewed: 05/20/2021 Elsevier Patient Education  [place] After The following information offers guidance on how to care for yourself after your procedure. Your health care provider may also give you more specific instructions. If you have problems or questions, contact your health care provider. What can I expect after the procedure? After the procedure, it is common to have: Tiredness. Little or no memory about what happened during or after the procedure. Impaired judgment when it comes to making decisions. Nausea or vomiting. Some trouble with balance. Follow these instructions at home: For the time period you were told by your health care provider:  Rest. Do not participate in activities where you could fall or become injured. Do not drive or use machinery. Do  not drink alcohol. Do not take sleeping pills or medicines that cause drowsiness. Do not make important decisions or sign legal documents. Do not take care of children on your own. Medicines Take over-the-counter and prescription medicines only as told by your health care provider. If you were prescribed antibiotics, take them as told by your health care provider. Do not stop using the antibiotic even if you start to feel better. Eating and drinking Follow instructions from your health care provider about what you may eat and drink. Drink enough fluid to keep your urine pale yellow. If you vomit: Drink clear fluids slowly and in small amounts as you are able. Clear fluids include water, ice chips, low-calorie sports drinks, and fruit juice that has water added to it (diluted fruit juice). Eat light and bland foods in small amounts  as you are able. These foods include bananas, applesauce, rice, lean meats, toast, and crackers. General instructions  Have a responsible adult stay with you for the time you are told. It is important to have someone help care for you until you are awake and alert. If you have sleep apnea, surgery and some medicines can increase your risk for breathing problems. Follow instructions from your health care provider about wearing your sleep device: When you are sleeping. This includes during daytime naps. While taking prescription pain medicines, sleeping medicines, or medicines that make you drowsy. Do not use any products that contain nicotine or tobacco. These products include cigarettes, chewing tobacco, and vaping devices, such as e-cigarettes. If you need help quitting, ask your health care provider. Contact a health care provider if: You feel nauseous or vomit every time you eat or drink. You feel light-headed. You are still sleepy or having trouble with balance after 24 hours. You get a rash. You have a fever. You have redness or swelling around the IV  site. Get help right away if: You have trouble breathing. You have new confusion after you get home. These symptoms may be an emergency. Get help right away. Call 911. Do not wait to see if the symptoms will go away. Do not drive yourself to the hospital. This information is not intended to replace advice given to you by your health care provider. Make sure you discuss any questions you have with your health care provider. Document Revised: 02/22/2022 Document Reviewed: 02/22/2022 Elsevier Patient Education  [place].

## 2023-02-04 NOTE — Pre-Procedure Instructions (Signed)
  RE: aggrenox Received: Clarene Duke, MD  Elsie Amis, RN no       Previous Messages    ----- Message ----- From: Elsie Amis, RN Sent: 02/03/2023   4:20 PM EDT To: Corbin Ade, MD Subject: aggrenox                                      Hey Dr Jena Gauss. Does Mycheal Cheyney need to hold his aggrenox before his TCS?

## 2023-02-05 ENCOUNTER — Other Ambulatory Visit: Payer: Self-pay

## 2023-02-05 ENCOUNTER — Encounter (HOSPITAL_BASED_OUTPATIENT_CLINIC_OR_DEPARTMENT_OTHER): Payer: Self-pay

## 2023-02-05 ENCOUNTER — Observation Stay (HOSPITAL_BASED_OUTPATIENT_CLINIC_OR_DEPARTMENT_OTHER)
Admission: EM | Admit: 2023-02-05 | Discharge: 2023-02-07 | Disposition: A | Discharge status: Home / Self Care | Payer: PPO | Attending: Surgery | Admitting: Surgery

## 2023-02-05 ENCOUNTER — Emergency Department (HOSPITAL_BASED_OUTPATIENT_CLINIC_OR_DEPARTMENT_OTHER): Payer: PPO

## 2023-02-05 DIAGNOSIS — R1031 Right lower quadrant pain: Secondary | ICD-10-CM | POA: Diagnosis not present

## 2023-02-05 DIAGNOSIS — N200 Calculus of kidney: Secondary | ICD-10-CM | POA: Diagnosis not present

## 2023-02-05 DIAGNOSIS — Z8673 Personal history of transient ischemic attack (TIA), and cerebral infarction without residual deficits: Secondary | ICD-10-CM | POA: Insufficient documentation

## 2023-02-05 DIAGNOSIS — I10 Essential (primary) hypertension: Secondary | ICD-10-CM | POA: Insufficient documentation

## 2023-02-05 DIAGNOSIS — Z79899 Other long term (current) drug therapy: Secondary | ICD-10-CM | POA: Insufficient documentation

## 2023-02-05 DIAGNOSIS — E039 Hypothyroidism, unspecified: Secondary | ICD-10-CM | POA: Diagnosis not present

## 2023-02-05 DIAGNOSIS — K5730 Diverticulosis of large intestine without perforation or abscess without bleeding: Secondary | ICD-10-CM | POA: Diagnosis not present

## 2023-02-05 DIAGNOSIS — R109 Unspecified abdominal pain: Secondary | ICD-10-CM | POA: Diagnosis present

## 2023-02-05 DIAGNOSIS — K353 Acute appendicitis with localized peritonitis, without perforation or gangrene: Secondary | ICD-10-CM | POA: Diagnosis not present

## 2023-02-05 DIAGNOSIS — K3580 Unspecified acute appendicitis: Secondary | ICD-10-CM | POA: Diagnosis present

## 2023-02-05 HISTORY — DX: Cerebral infarction, unspecified: I63.9

## 2023-02-05 LAB — CBC
HCT: 44.9 % (ref 39.0–52.0)
Hemoglobin: 15.2 g/dL (ref 13.0–17.0)
MCH: 30.3 pg (ref 26.0–34.0)
MCHC: 33.9 g/dL (ref 30.0–36.0)
MCV: 89.4 fL (ref 80.0–100.0)
Platelets: 191 10*3/uL (ref 150–400)
RBC: 5.02 MIL/uL (ref 4.22–5.81)
RDW: 14.2 % (ref 11.5–15.5)
WBC: 11.9 10*3/uL — ABNORMAL HIGH (ref 4.0–10.5)
nRBC: 0 % (ref 0.0–0.2)

## 2023-02-05 LAB — URINALYSIS, ROUTINE W REFLEX MICROSCOPIC
Bacteria, UA: NONE SEEN
Bilirubin Urine: NEGATIVE
Glucose, UA: NEGATIVE mg/dL
Ketones, ur: NEGATIVE mg/dL
Leukocytes,Ua: NEGATIVE
Nitrite: NEGATIVE
Specific Gravity, Urine: 1.023 (ref 1.005–1.030)
pH: 5.5 (ref 5.0–8.0)

## 2023-02-05 LAB — COMPREHENSIVE METABOLIC PANEL
ALT: 18 U/L (ref 0–44)
AST: 22 U/L (ref 15–41)
Albumin: 4.4 g/dL (ref 3.5–5.0)
Alkaline Phosphatase: 61 U/L (ref 38–126)
Anion gap: 11 (ref 5–15)
BUN: 17 mg/dL (ref 8–23)
CO2: 25 mmol/L (ref 22–32)
Calcium: 9.4 mg/dL (ref 8.9–10.3)
Chloride: 104 mmol/L (ref 98–111)
Creatinine, Ser: 1 mg/dL (ref 0.61–1.24)
GFR, Estimated: >60 mL/min (ref >60)
Glucose, Bld: 131 mg/dL — ABNORMAL HIGH (ref 70–99)
Potassium: 4.2 mmol/L (ref 3.5–5.1)
Sodium: 140 mmol/L (ref 135–145)
Total Bilirubin: 0.7 mg/dL (ref 0.3–1.2)
Total Protein: 7.3 g/dL (ref 6.5–8.1)

## 2023-02-05 LAB — LIPASE, BLOOD: Lipase: 12 U/L (ref 11–51)

## 2023-02-05 MED ORDER — METRONIDAZOLE 500 MG/100ML IV SOLN
500.0000 mg | Freq: Once | INTRAVENOUS | Status: AC
  Administered 2023-02-05: 500 mg via INTRAVENOUS
  Filled 2023-02-05: qty 100

## 2023-02-05 MED ORDER — SODIUM CHLORIDE 0.9 % IV SOLN: INTRAVENOUS | Status: AC

## 2023-02-05 MED ORDER — SODIUM CHLORIDE 0.9 % IV SOLN
2.0000 g | Freq: Once | INTRAVENOUS | Status: AC
  Administered 2023-02-05: 2 g via INTRAVENOUS
  Filled 2023-02-05: qty 20

## 2023-02-05 MED ORDER — IOHEXOL 300 MG/ML  SOLN
100.0000 mL | Freq: Once | INTRAMUSCULAR | Status: AC | PRN
  Administered 2023-02-05: 100 mL via INTRAVENOUS

## 2023-02-05 NOTE — ED Provider Notes (Signed)
EMERGENCY DEPARTMENT AT [facility] Provider Note   CSN: [id number] Arrival date & time: 02/05/23  1808     History  Chief Complaint  Patient presents with   Abdominal Pain    Russell Odom is a 67 y.o. male.  Patient is a 67 year old male with a history of hypertension, hypothyroidism, CVA currently on Aggrenox presenting today with abdominal pain.  Said he started feeling it this afternoon after he got off of the tractor and the pain is just gradually been worsening.  In the right side of his abdomen.  It is worse with any type of movement and he has not had an appetite.  No urinary symptoms or back pain.  No fever vomiting or nausea.  No prior history of abdominal surgeries.  The history is provided by the patient.  Abdominal Pain      Home Medications Prior to Admission medications   Medication Sig Start Date End Date Taking? Authorizing Provider  acetaminophen (TYLENOL) 500 MG tablet Take 1,000 mg by mouth every 6 (six) hours as needed for moderate pain.    [provider]  benazepril (LOTENSIN) 20 MG tablet TAKE 2 TABLETS (40 MG TOTAL) BY MOUTH 2 (TWO) TIMES DAILY. 09/09/20   Yates Decamp, MD  diphenhydrAMINE (BENADRYL) 25 MG tablet Take 25 mg by mouth daily as needed for itching.    [provider]  dipyridamole-aspirin (AGGRENOX) 200-25 MG 12hr capsule Take 1 capsule by mouth 2 (two) times daily. 11/12/05   [provider]  levothyroxine (SYNTHROID) 50 MCG tablet Take 50 mcg by mouth daily.    [provider]  Multiple Vitamins-Minerals (MULTIVITAMINS THER. W/MINERALS) TABS Take 1 tablet by mouth daily.    [provider]  Omega-3 Fatty Acids (FISH OIL) 1000 MG CAPS Take 1,000 mg by mouth 2 (two) times daily.    [provider]  rosuvastatin (CRESTOR) 20 MG tablet TAKE 1 TABLET BY MOUTH EVERY DAY 06/08/21   Patwardhan, Anabel Bene, MD      Allergies    Patient has no known allergies.    Review of  Systems   Review of Systems  Gastrointestinal:  Positive for abdominal pain.    Physical Exam Updated Vital Signs BP (!) 151/78 (BP Location: Right Arm)   Pulse 82   Temp 98.4 F (36.9 C) (Oral)   Resp 18   SpO2 93%  Physical Exam Vitals and nursing note reviewed.  Constitutional:      General: He is not in acute distress.    Appearance: He is well-developed.  HENT:     Head: Normocephalic and atraumatic.  Eyes:     Conjunctiva/sclera: Conjunctivae normal.     Pupils: Pupils are equal, round, and reactive to light.  Cardiovascular:     Rate and Rhythm: Normal rate and regular rhythm.     Heart sounds: No murmur heard. Pulmonary:     Effort: Pulmonary effort is normal. No respiratory distress.     Breath sounds: Normal breath sounds. No wheezing or rales.  Abdominal:     General: There is no distension.     Palpations: Abdomen is soft.     Tenderness: There is abdominal tenderness in the right lower quadrant. There is guarding. There is no right CVA tenderness, left CVA tenderness or rebound. Negative signs include Murphy's sign.  Musculoskeletal:        General: No tenderness. Normal range of motion.     Cervical back: Normal range of motion and  neck supple.  Skin:    General: Skin is warm and dry.     Findings: No erythema or rash.  Neurological:     Mental Status: He is alert and oriented to person, place, and time.  Psychiatric:        Behavior: Behavior normal.     ED Results / Procedures / Treatments   Labs (all labs ordered are listed, but only abnormal results are displayed) Labs Reviewed  COMPREHENSIVE METABOLIC PANEL - Abnormal; Notable for the following components:      Result Value   Glucose, Bld 131 (*)    All other components within normal limits  CBC - Abnormal; Notable for the following components:   WBC 11.9 (*)    All other components within normal limits  URINALYSIS, ROUTINE W REFLEX MICROSCOPIC - Abnormal; Notable for the following  components:   Hgb urine dipstick TRACE (*)    Protein, ur TRACE (*)    All other components within normal limits  LIPASE, BLOOD    EKG None  Radiology CT ABDOMEN PELVIS W CONTRAST  Result Date: 02/05/2023 CLINICAL DATA:  Right lower quadrant pain EXAM: CT ABDOMEN AND PELVIS WITH CONTRAST TECHNIQUE: Multidetector CT imaging of the abdomen and pelvis was performed using the standard protocol following bolus administration of intravenous contrast. RADIATION DOSE REDUCTION: This exam was performed according to the departmental dose-optimization program which includes automated exposure control, adjustment of the mA and/or kV according to patient size and/or use of iterative reconstruction technique. CONTRAST:  OMNIPAQUE IOHEXOL 300 MG/ML  SOLN COMPARISON:  CT abdomen and pelvis 08/10/2022 FINDINGS: Lower chest: No acute abnormality. Hepatobiliary: No focal liver abnormality is seen. No gallstones, gallbladder wall thickening, or biliary dilatation. Pancreas: Unremarkable. No pancreatic ductal dilatation or surrounding inflammatory changes. Spleen: Normal in size without focal abnormality. Adrenals/Urinary Tract: There is a nonobstructing 3 mm calculus in the left kidney. There is a left renal cyst measuring 15 mm. There is no hydronephrosis or perinephric fat stranding. The adrenal glands, right kidney and bladder are within normal limits. Stomach/Bowel: The appendix is dilated measuring 14 mm. There is wall thickening and surrounding inflammatory stranding. Appendicoliths is present. No evidence for perforation or abscess. There is no bowel obstruction. There is sigmoid colon diverticulosis. Small bowel is within normal limits. There is a small hiatal hernia. There is wall thickening of the distal esophagus. The stomach is otherwise within normal limits. Vascular/Lymphatic: Aortic atherosclerosis. No enlarged abdominal or pelvic lymph nodes. Reproductive: Prostate gland is enlarged. Other: There are  small fat containing inguinal and umbilical hernias. No ascites. Musculoskeletal: No fracture is seen. IMPRESSION: 1. Findings compatible with acute uncomplicated appendicitis. 2. Nonobstructing left renal calculus. 3. Colonic diverticulosis. 4. Small hiatal hernia with wall thickening of the distal esophagus. Correlate clinically for esophagitis. Aortic Atherosclerosis (ICD10-I70.0). Electronically Signed   By: Darliss Cheney M.D.   On: 02/05/2023 21:01    Procedures Procedures    Medications Ordered in ED Medications  cefTRIAXone (ROCEPHIN) 2 g in sodium chloride 0.9 % 100 mL IVPB (2 g Intravenous New Bag/Given 02/05/23 2132)    And  metroNIDAZOLE (FLAGYL) IVPB 500 mg (has no administration in time range)  iohexol (OMNIPAQUE) 300 MG/ML solution 100 mL (100 mLs Intravenous Contrast Given 02/05/23 2047)    ED Course/ Medical Decision Making/ A&P                             Medical Decision  Making Amount and/or Complexity of Data Reviewed Labs: ordered. Decision-making details documented in ED Course. Radiology: ordered and independent interpretation performed. Decision-making details documented in ED Course.  Risk Prescription drug management. Decision regarding hospitalization.   Pt with multiple medical problems and comorbidities and presenting today with a complaint that caries a high risk for morbidity and mortality.  Here today with right lower quadrant pain.  Concern for appendicitis versus incarcerated ventral hernia versus cholecystitis versus atypical diverticulitis versus kidney stone. I independently interpreted patient's labs lipase, CMP are within normal limits, CBC with mild leukocytosis of 11, UA with trace hemoglobin but no other acute findings.  I have independently visualized and interpreted pt's images today.  CT today shows appendicitis.  Radiology reports its uncomplicated.  Consulted with general surgery.  Patient given Rocephin and Flagyl.  Findings discussed with the  patient and plan.  He is comfortable with this plan.          Final Clinical Impression(s) / ED Diagnoses Final diagnoses:  Acute appendicitis with localized peritonitis, without perforation, abscess, or gangrene    Rx / DC Orders ED Discharge Orders     None         Gwyneth Sprout, MD 02/05/23 2134

## 2023-02-05 NOTE — ED Triage Notes (Signed)
He c/o sudden onset of rlq abd. Pain which began early afternoon today.

## 2023-02-06 ENCOUNTER — Other Ambulatory Visit: Payer: Self-pay

## 2023-02-06 ENCOUNTER — Encounter (HOSPITAL_COMMUNITY): Payer: Self-pay | Admitting: Surgery

## 2023-02-06 ENCOUNTER — Encounter (HOSPITAL_COMMUNITY)
Admission: EM | Disposition: A | Discharge status: Home / Self Care | Payer: Self-pay | Source: Home / Self Care | Attending: Emergency Medicine

## 2023-02-06 ENCOUNTER — Observation Stay (HOSPITAL_COMMUNITY): Payer: PPO | Admitting: Certified Registered Nurse Anesthetist

## 2023-02-06 ENCOUNTER — Observation Stay (HOSPITAL_BASED_OUTPATIENT_CLINIC_OR_DEPARTMENT_OTHER): Payer: PPO | Admitting: Certified Registered Nurse Anesthetist

## 2023-02-06 DIAGNOSIS — Z8673 Personal history of transient ischemic attack (TIA), and cerebral infarction without residual deficits: Secondary | ICD-10-CM

## 2023-02-06 DIAGNOSIS — K353 Acute appendicitis with localized peritonitis, without perforation or gangrene: Secondary | ICD-10-CM | POA: Diagnosis not present

## 2023-02-06 DIAGNOSIS — K3580 Unspecified acute appendicitis: Secondary | ICD-10-CM

## 2023-02-06 DIAGNOSIS — E039 Hypothyroidism, unspecified: Secondary | ICD-10-CM | POA: Diagnosis not present

## 2023-02-06 DIAGNOSIS — R109 Unspecified abdominal pain: Secondary | ICD-10-CM | POA: Diagnosis present

## 2023-02-06 DIAGNOSIS — I10 Essential (primary) hypertension: Secondary | ICD-10-CM | POA: Diagnosis not present

## 2023-02-06 DIAGNOSIS — Z79899 Other long term (current) drug therapy: Secondary | ICD-10-CM | POA: Diagnosis not present

## 2023-02-06 HISTORY — PX: LAPAROSCOPIC APPENDECTOMY: SHX408

## 2023-02-06 LAB — HIV ANTIBODY (ROUTINE TESTING W REFLEX): HIV Screen 4th Generation wRfx: NONREACTIVE

## 2023-02-06 SURGERY — APPENDECTOMY, LAPAROSCOPIC
Anesthesia: General

## 2023-02-06 MED ORDER — OXYCODONE HCL 5 MG PO TABS
5.0000 mg | ORAL_TABLET | ORAL | Status: DC | PRN
  Administered 2023-02-07: 5 mg via ORAL
  Filled 2023-02-06: qty 1

## 2023-02-06 MED ORDER — PROPOFOL 10 MG/ML IV BOLUS
INTRAVENOUS | Status: AC
  Filled 2023-02-06: qty 20

## 2023-02-06 MED ORDER — BUPIVACAINE-EPINEPHRINE (PF) 0.25% -1:200000 IJ SOLN
INTRAMUSCULAR | Status: AC
  Filled 2023-02-06: qty 30

## 2023-02-06 MED ORDER — ACETAMINOPHEN 10 MG/ML IV SOLN
INTRAVENOUS | Status: AC
  Filled 2023-02-06: qty 100

## 2023-02-06 MED ORDER — HYDROMORPHONE HCL 1 MG/ML IJ SOLN: 0.5000 mg | INTRAMUSCULAR | Status: DC | PRN

## 2023-02-06 MED ORDER — ACETAMINOPHEN 325 MG PO TABS: 650.0000 mg | ORAL_TABLET | Freq: Four times a day (QID) | ORAL | Status: DC | PRN

## 2023-02-06 MED ORDER — METRONIDAZOLE 500 MG/100ML IV SOLN
500.0000 mg | Freq: Two times a day (BID) | INTRAVENOUS | Status: DC
  Administered 2023-02-06 – 2023-02-07 (×3): 500 mg via INTRAVENOUS
  Filled 2023-02-06 (×3): qty 100

## 2023-02-06 MED ORDER — ONDANSETRON HCL 4 MG/2ML IJ SOLN: 4.0000 mg | Freq: Four times a day (QID) | INTRAMUSCULAR | Status: DC | PRN

## 2023-02-06 MED ORDER — ROCURONIUM BROMIDE 10 MG/ML (PF) SYRINGE
PREFILLED_SYRINGE | INTRAVENOUS | Status: AC
  Filled 2023-02-06: qty 10

## 2023-02-06 MED ORDER — MIDAZOLAM HCL 2 MG/2ML IJ SOLN
INTRAMUSCULAR | Status: AC
  Filled 2023-02-06: qty 2

## 2023-02-06 MED ORDER — DIPHENHYDRAMINE HCL 50 MG/ML IJ SOLN: 12.5000 mg | Freq: Four times a day (QID) | INTRAMUSCULAR | Status: DC | PRN

## 2023-02-06 MED ORDER — LIDOCAINE 2% (20 MG/ML) 5 ML SYRINGE
INTRAMUSCULAR | Status: AC
  Filled 2023-02-06: qty 5

## 2023-02-06 MED ORDER — DEXAMETHASONE SODIUM PHOSPHATE 10 MG/ML IJ SOLN
INTRAMUSCULAR | Status: DC | PRN
  Administered 2023-02-06: 10 mg via INTRAVENOUS

## 2023-02-06 MED ORDER — MIDAZOLAM HCL 2 MG/2ML IJ SOLN
INTRAMUSCULAR | Status: DC | PRN
  Administered 2023-02-06: 2 mg via INTRAVENOUS

## 2023-02-06 MED ORDER — LACTATED RINGERS IV SOLN: INTRAVENOUS | Status: DC

## 2023-02-06 MED ORDER — ACETAMINOPHEN 500 MG PO TABS: 1000.0000 mg | ORAL_TABLET | Freq: Four times a day (QID) | ORAL | Status: DC | PRN

## 2023-02-06 MED ORDER — ENOXAPARIN SODIUM 40 MG/0.4ML IJ SOSY
40.0000 mg | PREFILLED_SYRINGE | INTRAMUSCULAR | Status: DC
  Administered 2023-02-06: 40 mg via SUBCUTANEOUS
  Filled 2023-02-06: qty 0.4

## 2023-02-06 MED ORDER — LEVOTHYROXINE SODIUM 50 MCG PO TABS
50.0000 ug | ORAL_TABLET | Freq: Every day | ORAL | Status: DC
  Administered 2023-02-07: 50 ug via ORAL
  Filled 2023-02-06: qty 1

## 2023-02-06 MED ORDER — SODIUM CHLORIDE 0.9 % IV SOLN
2.0000 g | INTRAVENOUS | Status: DC
  Administered 2023-02-06: 2 g via INTRAVENOUS
  Filled 2023-02-06: qty 20

## 2023-02-06 MED ORDER — ONDANSETRON HCL 4 MG/2ML IJ SOLN: 4.0000 mg | Freq: Once | INTRAMUSCULAR | Status: DC | PRN

## 2023-02-06 MED ORDER — FENTANYL CITRATE (PF) 250 MCG/5ML IJ SOLN
INTRAMUSCULAR | Status: DC | PRN
  Administered 2023-02-06: 50 ug via INTRAVENOUS
  Administered 2023-02-06: 100 ug via INTRAVENOUS
  Administered 2023-02-06: 50 ug via INTRAVENOUS

## 2023-02-06 MED ORDER — DIPHENHYDRAMINE HCL 25 MG PO TABS: 25.0000 mg | ORAL_TABLET | Freq: Every day | ORAL | Status: DC | PRN

## 2023-02-06 MED ORDER — 0.9 % SODIUM CHLORIDE (POUR BTL) OPTIME
TOPICAL | Status: DC | PRN
  Administered 2023-02-06: 1000 mL

## 2023-02-06 MED ORDER — SUGAMMADEX SODIUM 200 MG/2ML IV SOLN
INTRAVENOUS | Status: DC | PRN
  Administered 2023-02-06: 200 mg via INTRAVENOUS

## 2023-02-06 MED ORDER — ONDANSETRON 4 MG PO TBDP: 4.0000 mg | ORAL_TABLET | Freq: Four times a day (QID) | ORAL | Status: DC | PRN

## 2023-02-06 MED ORDER — ROSUVASTATIN CALCIUM 20 MG PO TABS
20.0000 mg | ORAL_TABLET | Freq: Every day | ORAL | Status: DC
  Administered 2023-02-06 – 2023-02-07 (×2): 20 mg via ORAL
  Filled 2023-02-06 (×2): qty 1

## 2023-02-06 MED ORDER — DIPHENHYDRAMINE HCL 12.5 MG/5ML PO ELIX: 12.5000 mg | ORAL_SOLUTION | Freq: Four times a day (QID) | ORAL | Status: DC | PRN

## 2023-02-06 MED ORDER — DEXAMETHASONE SODIUM PHOSPHATE 10 MG/ML IJ SOLN
INTRAMUSCULAR | Status: AC
  Filled 2023-02-06: qty 1

## 2023-02-06 MED ORDER — PHENYLEPHRINE HCL-NACL 20-0.9 MG/250ML-% IV SOLN
INTRAVENOUS | Status: DC | PRN
  Administered 2023-02-06: 25 ug/min via INTRAVENOUS

## 2023-02-06 MED ORDER — ACETAMINOPHEN 10 MG/ML IV SOLN
INTRAVENOUS | Status: DC | PRN
  Administered 2023-02-06: 1000 mg via INTRAVENOUS

## 2023-02-06 MED ORDER — ONDANSETRON HCL 4 MG/2ML IJ SOLN
INTRAMUSCULAR | Status: AC
  Filled 2023-02-06: qty 2

## 2023-02-06 MED ORDER — PROPOFOL 10 MG/ML IV BOLUS
INTRAVENOUS | Status: DC | PRN
  Administered 2023-02-06: 150 mg via INTRAVENOUS

## 2023-02-06 MED ORDER — PHENYLEPHRINE 80 MCG/ML (10ML) SYRINGE FOR IV PUSH (FOR BLOOD PRESSURE SUPPORT)
PREFILLED_SYRINGE | INTRAVENOUS | Status: DC | PRN
  Administered 2023-02-06 (×3): 160 ug via INTRAVENOUS
  Administered 2023-02-06: 80 ug via INTRAVENOUS
  Administered 2023-02-06: 160 ug via INTRAVENOUS
  Administered 2023-02-06: 80 ug via INTRAVENOUS

## 2023-02-06 MED ORDER — BENAZEPRIL HCL 20 MG PO TABS
20.0000 mg | ORAL_TABLET | Freq: Two times a day (BID) | ORAL | Status: DC
  Administered 2023-02-06 – 2023-02-07 (×3): 20 mg via ORAL
  Filled 2023-02-06 (×4): qty 1

## 2023-02-06 MED ORDER — ONDANSETRON HCL 4 MG/2ML IJ SOLN
INTRAMUSCULAR | Status: DC | PRN
  Administered 2023-02-06: 4 mg via INTRAVENOUS

## 2023-02-06 MED ORDER — FENTANYL CITRATE (PF) 100 MCG/2ML IJ SOLN: 25.0000 ug | INTRAMUSCULAR | Status: DC | PRN

## 2023-02-06 MED ORDER — FENTANYL CITRATE (PF) 250 MCG/5ML IJ SOLN
INTRAMUSCULAR | Status: AC
  Filled 2023-02-06: qty 5

## 2023-02-06 MED ORDER — CHLORHEXIDINE GLUCONATE CLOTH 2 % EX PADS
6.0000 | MEDICATED_PAD | Freq: Once | CUTANEOUS | Status: AC
  Administered 2023-02-06: 6 via TOPICAL

## 2023-02-06 MED ORDER — CHLORHEXIDINE GLUCONATE 0.12 % MT SOLN
OROMUCOSAL | Status: AC
  Administered 2023-02-06: 15 mL
  Filled 2023-02-06: qty 15

## 2023-02-06 MED ORDER — LIDOCAINE 2% (20 MG/ML) 5 ML SYRINGE
INTRAMUSCULAR | Status: DC | PRN
  Administered 2023-02-06: 100 mg via INTRAVENOUS

## 2023-02-06 MED ORDER — BUPIVACAINE-EPINEPHRINE 0.25% -1:200000 IJ SOLN
INTRAMUSCULAR | Status: DC | PRN
  Administered 2023-02-06: 30 mL

## 2023-02-06 MED ORDER — ROCURONIUM BROMIDE 10 MG/ML (PF) SYRINGE
PREFILLED_SYRINGE | INTRAVENOUS | Status: DC | PRN
  Administered 2023-02-06: 10 mg via INTRAVENOUS
  Administered 2023-02-06: 60 mg via INTRAVENOUS

## 2023-02-06 SURGICAL SUPPLY — 42 items
APPLIER CLIP 5 13 M/L LIGAMAX5 (MISCELLANEOUS) ×1
BAG COUNTER SPONGE SURGICOUNT (BAG) ×1 IMPLANT
BLADE CLIPPER SURG (BLADE) IMPLANT
CANISTER SUCT 3000ML PPV (MISCELLANEOUS) ×1 IMPLANT
CHLORAPREP W/TINT 26 (MISCELLANEOUS) ×1 IMPLANT
CLIP APPLIE 5 13 M/L LIGAMAX5 (MISCELLANEOUS) IMPLANT
COVER SURGICAL LIGHT HANDLE (MISCELLANEOUS) ×1 IMPLANT
CUTTER FLEX LINEAR 45M (STAPLE) ×1 IMPLANT
DERMABOND ADVANCED .7 DNX12 (GAUZE/BANDAGES/DRESSINGS) ×1 IMPLANT
ELECT REM PT RETURN 9FT ADLT (ELECTROSURGICAL) ×1
ELECTRODE REM PT RTRN 9FT ADLT (ELECTROSURGICAL) ×1 IMPLANT
GOWN STRL REUS W/ TWL LRG LVL3 (GOWN DISPOSABLE) ×2 IMPLANT
GOWN STRL REUS W/ TWL XL LVL3 (GOWN DISPOSABLE) ×1 IMPLANT
GOWN STRL REUS W/TWL LRG LVL3 (GOWN DISPOSABLE) ×1
GOWN STRL REUS W/TWL XL LVL3 (GOWN DISPOSABLE) ×2
GRASPER SUT TROCAR 14GX15 (MISCELLANEOUS) ×1 IMPLANT
IRRIG SUCT STRYKERFLOW 2 WTIP (MISCELLANEOUS) ×1
IRRIGATION SUCT STRKRFLW 2 WTP (MISCELLANEOUS) ×1 IMPLANT
KIT BASIN OR (CUSTOM PROCEDURE TRAY) ×1 IMPLANT
KIT TURNOVER KIT B (KITS) ×1 IMPLANT
NDL 22X1.5 STRL (OR ONLY) (MISCELLANEOUS) ×1 IMPLANT
NDL INSUFFLATION 14GA 120MM (NEEDLE) ×1 IMPLANT
NEEDLE 22X1.5 STRL (OR ONLY) (MISCELLANEOUS) ×1 IMPLANT
NEEDLE INSUFFLATION 14GA 120MM (NEEDLE) ×1 IMPLANT
NS IRRIG 1000ML POUR BTL (IV SOLUTION) ×1 IMPLANT
PAD ARMBOARD 7.5X6 YLW CONV (MISCELLANEOUS) ×2 IMPLANT
RELOAD 45 VASCULAR/THIN (ENDOMECHANICALS) ×1 IMPLANT
RELOAD STAPLE 45 2.5 WHT GRN (ENDOMECHANICALS) IMPLANT
SCISSORS LAP 5X35 DISP (ENDOMECHANICALS) IMPLANT
SET TUBE SMOKE EVAC HIGH FLOW (TUBING) ×1 IMPLANT
SHEARS HARMONIC ACE PLUS 36CM (ENDOMECHANICALS) ×1 IMPLANT
SLEEVE Z-THREAD 5X100MM (TROCAR) ×1 IMPLANT
SPECIMEN JAR SMALL (MISCELLANEOUS) ×1 IMPLANT
SUT MNCRL AB 4-0 PS2 18 (SUTURE) ×1 IMPLANT
SYS BAG RETRIEVAL 10MM (BASKET) ×1
SYSTEM BAG RETRIEVAL 10MM (BASKET) ×1 IMPLANT
TOWEL GREEN STERILE FF (TOWEL DISPOSABLE) ×1 IMPLANT
TRAY LAPAROSCOPIC MC (CUSTOM PROCEDURE TRAY) ×1 IMPLANT
TROCAR XCEL NON-BLD 5MMX100MML (ENDOMECHANICALS) ×1 IMPLANT
TROCAR Z THREAD OPTICAL 12X100 (TROCAR) ×1 IMPLANT
WARMER LAPAROSCOPE (MISCELLANEOUS) ×1 IMPLANT
WATER STERILE IRR 1000ML POUR (IV SOLUTION) ×1 IMPLANT

## 2023-02-06 NOTE — Anesthesia Preprocedure Evaluation (Signed)
Anesthesia Evaluation  Patient identified by MRN, date of birth, ID band Patient awake    Reviewed: Allergy & Precautions, NPO status , Patient's Chart, lab work & pertinent test results  Airway Mallampati: II  TM Distance: >3 FB Neck ROM: Full    Dental  (+) Dental Advisory Given, Edentulous Upper   Pulmonary neg pulmonary ROS   Pulmonary exam normal breath sounds clear to auscultation       Cardiovascular hypertension, Pt. on medications Normal cardiovascular exam Rhythm:Regular Rate:Normal     Neuro/Psych CVA, No Residual Symptoms    GI/Hepatic Neg liver ROS,,, acute appendicitis   Endo/Other  Hypothyroidism    Renal/GU negative Renal ROS     Musculoskeletal  (+) Arthritis ,    Abdominal   Peds  Hematology negative hematology ROS (+)   Anesthesia Other Findings Day of surgery medications reviewed with the patient.  Reproductive/Obstetrics                             Anesthesia Physical Anesthesia Plan  ASA: 3  Anesthesia Plan: General   Post-op Pain Management: Ofirmev IV (intra-op)* and Toradol IV (intra-op)*   Induction: Intravenous  PONV Risk Score and Plan: 3 and Dexamethasone and Ondansetron  Airway Management Planned: Oral ETT  Additional Equipment:   Intra-op Plan:   Post-operative Plan: Extubation in OR  Informed Consent: I have reviewed the patients History and Physical, chart, labs and discussed the procedure including the risks, benefits and alternatives for the proposed anesthesia with the patient or authorized representative who has indicated his/her understanding and acceptance.     Dental advisory given  Plan Discussed with: CRNA  Anesthesia Plan Comments:        Anesthesia Quick Evaluation

## 2023-02-06 NOTE — Progress Notes (Signed)
Acute appendicitis.  Discussed with Dr. Freida Busman in signout.  Proceed with laparoscopic appendectomy.  R/B/A discussed.  Quentin Ore, MD General, Bariatric and Minimally Invasive Surgery [facility]

## 2023-02-06 NOTE — H&P (Signed)
Russell Odom 1956-01-29  [id number].     HPI:  Russell Odom is a 67 yo male who presented to the ED with abdominal pain. The pain started yesterday around lunchtime, and is focused in the RLQ. It does not radiated anywhere else. He denies nausea, vomiting, fevers and chills. His WBC is mildly elevated at 11.9. A CT scan showed acute appendicitis with an appendicolith.   He has not had any prior abdominal surgeries. He takes Aggrenox for a remote history of CVA in 2007 (he denies any residual deficits), and his last dose was yesterday morning.  He had a cardiac stress test in 2020 that was normal.  His last colonoscopy was in 2019 and did not show any polyps, only an enlarged fold at the ileocecal valve, biopsies of which were benign.  He was scheduled for another colonoscopy this coming Wednesday.  ROS: Review of Systems  Constitutional:  Negative for chills and fever.  Respiratory:  Negative for shortness of breath.   Gastrointestinal:  Positive for abdominal pain. Negative for nausea and vomiting.  Neurological:  Negative for loss of consciousness.    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   History of esophageal dilatation    History of kidney stones    HTN (hypertension)    Hypothyroid    Mixed hyperlipidemia     Past Surgical History:  Procedure Laterality Date   BIOPSY  02/01/2018   Procedure: BIOPSY;  Surgeon: Russell Ade, MD;  Location: [facility];  Service: Endoscopy;;  ileocecal valve   COLONOSCOPY    07/04/2003   RMR: anal canal hemorrhoids, normal colon   COLONOSCOPY  11/02/2012   Procedure:  COLONOSCOPY;  Surgeon: Russell Ade, MD;  Location: [facility];  Service: Endoscopy;  Laterality: N/A;  9:30   COLONOSCOPY N/A 02/01/2018   Procedure: COLONOSCOPY;  Surgeon: Russell Ade, MD;  Location: [facility];  Service: Endoscopy;  Laterality: N/A;  9:45   ESOPHAGOGASTRODUODENOSCOPY  07/04/2003   RMR: Multiple linear esophageal erosions consistent with mild-to-moderate  erosive reflux esophagitis, status post passage of a 58 F Maloney dilator/ Multiple antral and duodenal bulbar erosions; otherwise, the remainder of the stomach and the first and second portions of the duodenum appeared normal   EXTRACORPOREAL SHOCK WAVE LITHOTRIPSY Right 11/13/2018   Procedure: EXTRACORPOREAL SHOCK WAVE LITHOTRIPSY (ESWL);  Surgeon: Russell Elliot, MD;  Location: [facility];  Service: Urology;  Laterality: Right;   EXTRACORPOREAL SHOCK WAVE LITHOTRIPSY Right 01/29/2019   Procedure: EXTRACORPOREAL SHOCK WAVE LITHOTRIPSY (ESWL);  Surgeon: Russell Elliot, MD;  Location: [facility];  Service: Urology;  Laterality: Right;   LUMBAR LAMINECTOMY/DECOMPRESSION MICRODISCECTOMY Left 04/09/2020   Procedure: Lumbar four through lumbar five Discectomy left;  Surgeon: Russell Lick, MD;  Location: [facility] OR;  Service: Orthopedics;  Laterality: Left;  2 hrs    Social History:  reports that he has never smoked. He has never used smokeless tobacco. He reports that he does not drink alcohol and does not use drugs.  Allergies: No Known Allergies  Medications Prior to Admission  Medication Sig Dispense  Refill   acetaminophen (TYLENOL) 500 MG tablet Take 1,000 mg by mouth every 6 (six) hours as needed for moderate pain.     benazepril (LOTENSIN) 20 MG tablet TAKE 2 TABLETS (40 MG TOTAL) BY MOUTH 2 (TWO) TIMES DAILY. 180 tablet 0   diphenhydrAMINE (BENADRYL) 25 MG tablet Take 25 mg by mouth daily as needed for itching.     dipyridamole-aspirin (AGGRENOX) 200-25 MG 12hr capsule Take 1 capsule by mouth 2 (two) times daily.      levothyroxine (SYNTHROID) 50 MCG tablet Take 50 mcg by mouth daily.     Multiple Vitamins-Minerals (MULTIVITAMINS THER. W/MINERALS) TABS Take 1 tablet by mouth daily.     Omega-3 Fatty Acids (FISH OIL) 1000 MG CAPS Take 1,000 mg by mouth 2 (two) times daily.     rosuvastatin (CRESTOR) 20 MG tablet TAKE 1 TABLET BY MOUTH EVERY DAY 90 tablet 3     Physical Exam: Blood pressure (!) 143/82, pulse 79, temperature 98.4 F (36.9 C), temperature source Oral, resp. rate (!) 22, SpO2 99 %. General: resting comfortably, appears stated age, no apparent distress Neurological: alert and oriented, no focal deficits, cranial nerves grossly in tact HEENT: normocephalic, atraumatic CV: regular rate and rhythm, no murmurs, extremities warm and well-perfused Respiratory: normal work of breathing on room air Abdomen: soft, nondistended, focally tender to palpation in the RLQ. No rebound tenderness or guarding. Extremities: warm and well-perfused, no deformities, moving all extremities spontaneously Psychiatric: normal mood and affect Skin: warm and dry, no jaundice, no rashes or lesions   Results for orders placed or performed during the hospital encounter of 02/05/23 (from the past 48 hour(s))  Lipase, blood     Status: None   Collection Time: 02/05/23  7:42 PM  Result Value Ref Range   Lipase 12 11 - 51 U/L    Comment: Performed at [facility], [address] [place], [place] [address]  Comprehensive metabolic panel     Status: Abnormal   Collection Time: 02/05/23  7:42 PM  Result Value Ref Range   Sodium 140 135 - 145 mmol/L   Potassium 4.2 3.5 - 5.1 mmol/L   Chloride 104 98 - 111 mmol/L   CO2 25 22 - 32 mmol/L   Glucose, Bld 131 (H) 70 - 99 mg/dL    Comment: Glucose reference range applies only to samples taken after fasting for at least 8 hours.   BUN 17 8 - 23 mg/dL   Creatinine, Ser 6.04 0.61 - 1.24 mg/dL   Calcium 9.4 8.9 - 54.0 mg/dL   Total Protein 7.3 6.5 - 8.1 g/dL    Albumin 4.4 3.5 - 5.0 g/dL   AST 22 15 - 41 U/L   ALT 18 0 - 44 U/L   Alkaline Phosphatase 61 38 - 126 U/L   Total Bilirubin 0.7 0.3 - 1.2 mg/dL   GFR, Estimated >98 >11 mL/min    Comment: (NOTE) Calculated using the CKD-EPI Creatinine Equation (2021)    Anion gap 11 5 - 15    Comment: Performed at [facility], [address], [place], [place] [address]  CBC     Status: Abnormal   Collection Time: 02/05/23  7:42 PM  Result Value Ref Range   WBC 11.9 (H) 4.0 - 10.5 K/uL   RBC 5.02 4.22 - 5.81 MIL/uL   Hemoglobin 15.2 13.0 - 17.0 g/dL   HCT 29.5 62.1 - 30.8 %   MCV 89.4 80.0 - 100.0 fL   MCH 30.3  26.0 - 34.0 pg   MCHC 33.9 30.0 - 36.0 g/dL   RDW 16.1 09.6 - 04.5 %   Platelets 191 150 - 400 K/uL   nRBC 0.0 0.0 - 0.2 %    Comment: Performed at [facility], [address] [place], [place] [address]  Urinalysis, Routine w reflex microscopic -Urine, Clean Catch     Status: Abnormal   Collection Time: 02/05/23  7:42 PM  Result Value Ref Range   Color, Urine YELLOW YELLOW   APPearance CLEAR CLEAR   Specific Gravity, Urine 1.023 1.005 - 1.030   pH 5.5 5.0 - 8.0   Glucose, UA NEGATIVE NEGATIVE mg/dL   Hgb urine dipstick TRACE (A) NEGATIVE   Bilirubin Urine NEGATIVE NEGATIVE   Ketones, ur NEGATIVE NEGATIVE mg/dL   Protein, ur TRACE (A) NEGATIVE mg/dL   Nitrite NEGATIVE NEGATIVE   Leukocytes,Ua NEGATIVE NEGATIVE   RBC / HPF 21-50 0 - 5 RBC/hpf   WBC, UA 0-5 0 - 5 WBC/hpf   Bacteria, UA NONE SEEN NONE SEEN   Squamous Epithelial / HPF 0-5 0 - 5 /HPF   Mucus PRESENT     Comment: Performed at [facility], [address], [place], [place] [address]   CT ABDOMEN PELVIS W CONTRAST  Result Date: 02/05/2023 CLINICAL DATA:  Right lower quadrant pain EXAM: CT ABDOMEN AND PELVIS WITH CONTRAST TECHNIQUE: Multidetector CT imaging of the abdomen and pelvis was performed using the standard protocol following bolus administration of  intravenous contrast. RADIATION DOSE REDUCTION: This exam was performed according to the departmental dose-optimization program which includes automated exposure control, adjustment of the mA and/or kV according to patient size and/or use of iterative reconstruction technique. CONTRAST:  OMNIPAQUE IOHEXOL 300 MG/ML  SOLN COMPARISON:  CT abdomen and pelvis 08/10/2022 FINDINGS: Lower chest: No acute abnormality. Hepatobiliary: No focal liver abnormality is seen. No gallstones, gallbladder wall thickening, or biliary dilatation. Pancreas: Unremarkable. No pancreatic ductal dilatation or surrounding inflammatory changes. Spleen: Normal in size without focal abnormality. Adrenals/Urinary Tract: There is a nonobstructing 3 mm calculus in the left kidney. There is a left renal cyst measuring 15 mm. There is no hydronephrosis or perinephric fat stranding. The adrenal glands, right kidney and bladder are within normal limits. Stomach/Bowel: The appendix is dilated measuring 14 mm. There is wall thickening and surrounding inflammatory stranding. Appendicoliths is present. No evidence for perforation or abscess. There is no bowel obstruction. There is sigmoid colon diverticulosis. Small bowel is within normal limits. There is a small hiatal hernia. There is wall thickening of the distal esophagus. The stomach is otherwise within normal limits. Vascular/Lymphatic: Aortic atherosclerosis. No enlarged abdominal or pelvic lymph nodes. Reproductive: Prostate gland is enlarged. Other: There are small fat containing inguinal and umbilical hernias. No ascites. Musculoskeletal: No fracture is seen. IMPRESSION: 1. Findings compatible with acute uncomplicated appendicitis. 2. Nonobstructing left renal calculus. 3. Colonic diverticulosis. 4. Small hiatal hernia with wall thickening of the distal esophagus. Correlate clinically for esophagitis. Aortic Atherosclerosis (ICD10-I70.0). Electronically Signed   By: Darliss Cheney M.D.   On:  02/05/2023 21:01      Assessment/Plan 67 yo male presenting with acute RLQ abdominal pain.  Personally reviewed his labs, notes, and imaging.  His CT scan shows dilation and stranding around the appendix with an appendicolith, consistent with acute appendicitis.  No signs of perforation or abscess.  I recommended laparoscopic appendectomy and reviewed the details of this procedure with the patient.  He expressed understanding and agrees  to proceed with surgery.  All questions were answered. - NPO, IV fluid hydration - Continue antibiotics (ceftriaxone and flagyl started in ED) - Pain control - OR today for laparoscopic appendectomy by Dr. Dossie Der. - VTE: SCDs, lovenox - Admit to observation, anticipate discharge home following surgery.   Sophronia Simas, MD [facility] General, Hepatobiliary and Pancreatic Surgery 02/06/23 6:39 AM

## 2023-02-06 NOTE — Anesthesia Procedure Notes (Signed)
Procedure Name: Intubation Date/Time: 02/06/2023 11:01 AM  Performed by: Nils Pyle, CRNAPre-anesthesia Checklist: Patient identified, Emergency Drugs available, Suction available and Patient being monitored Patient Re-evaluated:Patient Re-evaluated prior to induction Oxygen Delivery Method: Circle System Utilized Preoxygenation: Pre-oxygenation with 100% oxygen Induction Type: IV induction Ventilation: Mask ventilation without difficulty and Oral airway inserted - appropriate to patient size Laryngoscope Size: Hyacinth Meeker and 2 Grade View: Grade I Tube type: Oral Tube size: 7.5 mm Number of attempts: 1 Airway Equipment and Method: Stylet and Oral airway Placement Confirmation: ETT inserted through vocal cords under direct vision, positive ETCO2 and breath sounds checked- equal and bilateral Secured at: 23 cm Tube secured with: Tape Dental Injury: Teeth and Oropharynx as per pre-operative assessment

## 2023-02-06 NOTE — Transfer of Care (Signed)
Immediate Anesthesia Transfer of Care Note  Patient: Russell Odom  Procedure(s) Performed: APPENDECTOMY LAPAROSCOPIC  Patient Location: PACU  Anesthesia Type:General  Level of Consciousness: awake and alert   Airway & Oxygen Therapy: Patient Spontanous Breathing  Post-op Assessment: Report given to RN, Post -op Vital signs reviewed and stable, and Patient moving all extremities X 4  Post vital signs: Reviewed and stable  Last Vitals:  Vitals Value Taken Time  BP 159/71   Temp    Pulse 93 02/06/23 1205  Resp 15 02/06/23 1205  SpO2 90 % 02/06/23 1205  Vitals shown include unvalidated device data.  Last Pain:  Vitals:   02/06/23 1027  TempSrc: Oral  PainSc: 6          Complications: No notable events documented.

## 2023-02-06 NOTE — Progress Notes (Signed)
Patient transfer from [facility],alert and oriented,complain of pain on a pain scale of 5/10 to right lower abdomen,patient assessment done and made comfortable,bed in low position and call light in reach

## 2023-02-06 NOTE — Op Note (Signed)
Patient: Russell Odom (07-Jun-1956, [id number])  Date of Surgery: 02/06/23  Preoperative Diagnosis: Acute appendicitis   Postoperative Diagnosis: Acute appendicitis   Surgical Procedure: APPENDECTOMY LAPAROSCOPIC:    Operative Team Members:  Surgeon(s) and Role:    * Bilbo Carcamo, Hyman Hopes, MD - Primary   Anesthesiologist: Collene Schlichter, MD CRNA: Nils Pyle, CRNA   Anesthesia: General   Fluids:  Total I/O In: 1100 [I.V.:1000; IV Piggyback:100] Out: 20 [Blood:20]  Complications: None  Drains:  none   Specimen:  ID Type Source Tests Collected by Time Destination  1 : Appendix GI Appendix SURGICAL PATHOLOGY Alexzandra Bilton, Hyman Hopes, MD 02/06/2023 1132      Disposition:  PACU - hemodynamically stable.  Plan of Care: Admit for overnight observation    Indications for Procedure: Russell Odom is a 67 y.o. male who presented with abdominal pain.  History, physical and imaging was concerning for appendicitis, so laparoscopic appendectomy was recommended for the patient.  The procedure itself, as well as the risks, benefits and alternatives were discussed with the patient.  Risks discussed included but were not limited to the risk of bleeding, infection, damage to nearby structures, need to convert to open procedure, incisional hernia, and the need for additional procedures or surgeries.  With this discussion complete and all questions answered the patient granted consent to proceed.  Findings: Inflamed appendix.  Some oozing noted from antiplatelet  Infection status: Patient: Russell Odom Emergency General Surgery Service Patient Case: Urgent Infection Present At Time Of Surgery (PATOS):  Infected appendix   Description of Procedure:   On the date stated above, the patient was taken to the operating room suite and placed in supine positioning with the left arm tucked.  Sequential compression devices were placed on the lower extremities to prevent blood clots.  General  endotracheal anesthesia was induced.  The patient urinated just prior to surgery so a foley catheter was not placed.  Preoperative antibiotics were given.  The patient's abdomen was prepped and draped in the usual sterile fashion.  A time-out was completed verifying the correct patient, procedure, positioning and equipment needed for the case.  We began by anesthetizing the skin with local anesthetic and then making a 5 mm incision just below the umbilicus.  There was a small umbilical hernia so the 12 mm trocar was placed here and the abdomen was inflated to .  There was no trauma to the underlying viscera with initial trocar placement.  Any abnormal findings, other than inflammation in the right lower quadrant, are listed above in the findings section.  Two additional trocars were placed, one 5 mm trocar suprapubically and one 5 mm trocar in the left lower quadrant.  These were placed under direct vision without any trauma to the underlying viscera.    The patient was then placed in head down, left side down positioning.  The appendix was identified and dissected free from its attachments to the abdominal wall, small intestine and cecum.  A window was created in the mesoappendix using blunt dissection.  We used one 45mm white load of the endoscopic linear stapler to divide the base of the appendix from the cecum.  Then the harmonic scaplel was used to divide the mesoappendix.  The appendix was placed in an endocatch bag and removed through the 12mm trocar..  There was some oozing near the staple line.  Multiple clips were placed to control this bleeding. The staple line was well formed.   At this point  we directed our attention to closure.  The patient was moved back to a level position.  The 12 mm trocar site was closed at the fascial level using an 0-vicryl on a fascial suture passer.  The abdomen was desufflated.  The skin was closed using 4-0 Monocryl and dermabond.  All sponge and needle counts were  correct at the end of the case.    Ivar Drape, MD General, Bariatric, & Minimally Invasive Surgery [facility], [place]

## 2023-02-06 NOTE — Anesthesia Postprocedure Evaluation (Signed)
Anesthesia Post Note  Patient: Russell Odom  Procedure(s) Performed: APPENDECTOMY LAPAROSCOPIC     Patient location during evaluation: PACU Anesthesia Type: General Level of consciousness: awake and alert Pain management: pain level controlled Vital Signs Assessment: post-procedure vital signs reviewed and stable Respiratory status: spontaneous breathing, nonlabored ventilation, respiratory function stable and patient connected to nasal cannula oxygen Cardiovascular status: blood pressure returned to baseline and stable Postop Assessment: no apparent nausea or vomiting Anesthetic complications: no   No notable events documented.  Last Vitals:  Vitals:   02/06/23 1351 02/06/23 1626  BP: 120/67 107/69  Pulse: 72 67  Resp: 20 17  Temp:  (!) 36.4 C  SpO2: 91% 96%    Last Pain:  Vitals:   02/06/23 2033  TempSrc:   PainSc: 0-No pain                 Collene Schlichter

## 2023-02-07 ENCOUNTER — Encounter (HOSPITAL_COMMUNITY)
Admission: RE | Admit: 2023-02-07 | Discharge: 2023-02-07 | Disposition: A | Discharge status: Home / Self Care | Payer: PPO | Source: Ambulatory Visit | Attending: Internal Medicine | Admitting: Internal Medicine

## 2023-02-07 ENCOUNTER — Telehealth: Payer: Self-pay | Admitting: *Deleted

## 2023-02-07 ENCOUNTER — Encounter (HOSPITAL_COMMUNITY): Payer: Self-pay | Admitting: Surgery

## 2023-02-07 ENCOUNTER — Encounter (HOSPITAL_COMMUNITY): Payer: Self-pay

## 2023-02-07 MED ORDER — ASPIRIN-DIPYRIDAMOLE ER 25-200 MG PO CP12: 1.0000 | ORAL_CAPSULE | Freq: Two times a day (BID) | ORAL | Status: DC

## 2023-02-07 MED ORDER — OXYCODONE HCL 5 MG PO TABS: 5.0000 mg | ORAL_TABLET | Freq: Four times a day (QID) | ORAL | 0 refills | Status: AC | PRN

## 2023-02-07 NOTE — Telephone Encounter (Signed)
Mandy please add to recall list for triage in 3 months. Thanks!

## 2023-02-07 NOTE — Telephone Encounter (Signed)
Rourk, Gerrit Friends, MD  Stechschulte, Hyman Hopes, MD; Armstead Peaks, CMA Thanks for the heads up       Previous Messages    ----- Message ----- From: Quentin Ore, MD Sent: 02/06/2023  12:20 PM EDT To: Corbin Ade, MD Subject: Appendectomy                                  Mr. Tobon had an appendectomy today so his colonoscopy will need to be rescheduled.  Thanks, Renae Fickle

## 2023-02-07 NOTE — Discharge Instructions (Signed)
[facility] ° °Please arrive at least 30 min before your appointment to complete your check in paperwork.  If you are unable to arrive 30 min prior to your appointment time we may have to cancel or reschedule you. °LAPAROSCOPIC SURGERY: POST OP INSTRUCTIONS °Always review your discharge instruction sheet given to you by the facility where your surgery was performed. °IF YOU HAVE DISABILITY OR FAMILY LEAVE FORMS, YOU MUST BRING THEM TO THE OFFICE FOR PROCESSING.   °DO NOT GIVE THEM TO YOUR DOCTOR. ° °PAIN CONTROL ° °First take acetaminophen (Tylenol) AND/or ibuprofen (Advil) to control your pain after surgery.  Follow directions on package.  Taking acetaminophen (Tylenol) and/or ibuprofen (Advil) regularly after surgery will help to control your pain and lower the amount of prescription pain medication you may need.  You should not take more than 4,000 mg (4 grams) of acetaminophen (Tylenol) in 24 hours.  You should not take ibuprofen (Advil), aleve, motrin, naprosyn or other NSAIDS if you have a history of stomach ulcers or chronic kidney disease.  °A prescription for pain medication may be given to you upon discharge.  Take your pain medication as prescribed, if you still have uncontrolled pain after taking acetaminophen (Tylenol) or ibuprofen (Advil). °Use ice packs to help control pain. °If you need a refill on your pain medication, please contact your pharmacy.  They will contact our office to request authorization. Prescriptions will not be filled after 5pm or on week-ends. ° °HOME MEDICATIONS °Take your usually prescribed medications unless otherwise directed. ° °DIET °You should follow a light diet the first few days after arrival home.  Be sure to include lots of fluids daily. Avoid fatty, fried foods.  ° °CONSTIPATION °It is common to experience some constipation after surgery and if you are taking pain medication.  Increasing fluid intake and taking a stool softener (such as Colace)  will usually help or prevent this problem from occurring.  A mild laxative (Milk of Magnesia or Miralax) should be taken according to package instructions if there are no bowel movements after 48 hours. ° °WOUND/INCISION CARE °Most patients will experience some swelling and bruising in the area of the incisions.  Ice packs will help.  Swelling and bruising can take several days to resolve.  °Unless discharge instructions indicate otherwise, follow guidelines below  °STERI-STRIPS - you may remove your outer bandages 48 hours after surgery, and you may shower at that time.  You have steri-strips (small skin tapes) in place directly over the incision.  These strips should be left on the skin for 7-10 days.   °DERMABOND/SKIN GLUE - you may shower in 24 hours.  The glue will flake off over the next 2-3 weeks. °Any sutures or staples will be removed at the office during your follow-up visit. ° °ACTIVITIES °You may resume regular (light) daily activities beginning the next day--such as daily self-care, walking, climbing stairs--gradually increasing activities as tolerated.  You may have sexual intercourse when it is comfortable.  Refrain from any heavy lifting or straining until approved by your doctor. °You may drive when you are no longer taking prescription pain medication, you can comfortably wear a seatbelt, and you can safely maneuver your car and apply brakes. ° °FOLLOW-UP °You should see your doctor in the office for a follow-up appointment approximately 2-3 weeks after your surgery.  You should have been given your post-op/follow-up appointment when your surgery was scheduled.  If you did not receive a post-op/follow-up appointment, make sure   that you call for this appointment within a day or two after you arrive home to insure a convenient appointment time. ° ° °WHEN TO CALL YOUR DOCTOR: °Fever over 101.0 °Inability to urinate °Continued bleeding from incision. °Increased pain, redness, or drainage from the  incision. °Increasing abdominal pain ° °The clinic staff is available to answer your questions during regular business hours.  Please don’t hesitate to call and ask to speak to one of the nurses for clinical concerns.  If you have a medical emergency, go to the nearest emergency room or call 911.  A surgeon from [facility] is always on call at the hospital. °[address], [address], [place], [place]  [address] ? [address] [address], [place], [place]   [address] °[phone] ? [phone] ? FAX [phone] ° ° ° ° °Managing Your Pain After Surgery Without Opioids ° ° ° °Thank you for participating in our program to help patients manage their pain after surgery without opioids. This is part of our effort to provide you with the best care possible, without exposing you or your family to the risk that opioids pose. ° °What pain can I expect after surgery? °You can expect to have some pain after surgery. This is normal. The pain is typically worse the day after surgery, and quickly begins to get better. °Many studies have found that many patients are able to manage their pain after surgery with Over-the-Counter (OTC) medications such as Tylenol and Motrin. If you have a condition that does not allow you to take Tylenol or Motrin, notify your surgical team. ° °How will I manage my pain? °The best strategy for controlling your pain after surgery is around the clock pain control with Tylenol (acetaminophen) and Motrin (ibuprofen or Advil). Alternating these medications with each other allows you to maximize your pain control. In addition to Tylenol and Motrin, you can use heating pads or ice packs on your incisions to help reduce your pain. ° °How will I alternate your regular strength over-the-counter pain medication? °You will take a dose of pain medication every three hours. °Start by taking 650 mg of Tylenol (2 pills of 325 mg) °3 hours later take 600 mg of Motrin (3 pills of 200 mg) °3 hours after  taking the Motrin take 650 mg of Tylenol °3 hours after that take 600 mg of Motrin. ° ° °- 1 - ° °See example - if your first dose of Tylenol is at 12:00 PM ° ° °12:00 PM Tylenol 650 mg (2 pills of 325 mg)  °3:00 PM Motrin 600 mg (3 pills of 200 mg)  °6:00 PM Tylenol 650 mg (2 pills of 325 mg)  °9:00 PM Motrin 600 mg (3 pills of 200 mg)  °Continue alternating every 3 hours  ° °We recommend that you follow this schedule around-the-clock for at least 3 days after surgery, or until you feel that it is no longer needed. Use the table on the last page of this handout to keep track of the medications you are taking. °Important: °Do not take more than 3000mg of Tylenol or 3200mg of Motrin in a 24-hour period. °Do not take ibuprofen/Motrin if you have a history of bleeding stomach ulcers, severe kidney disease, &/or actively taking a blood thinner ° °What if I still have pain? °If you have pain that is not controlled with the over-the-counter pain medications (Tylenol and Motrin or Advil) you might have what we call “breakthrough” pain. You will receive a prescription   for a small amount of an opioid pain medication such as Oxycodone, Tramadol, or Tylenol with Codeine. Use these opioid pills in the first 24 hours after surgery if you have breakthrough pain. Do not take more than 1 pill every 4-6 hours. ° °If you still have uncontrolled pain after using all opioid pills, don't hesitate to call our staff using the number provided. We will help make sure you are managing your pain in the best way possible, and if necessary, we can provide a prescription for additional pain medication. ° ° °Day 1   ° °Time  °Name of Medication Number of pills taken  °Amount of Acetaminophen  °Pain Level  ° °Comments  °AM PM       °AM PM       °AM PM       °AM PM       °AM PM       °AM PM       °AM PM       °AM PM       °Total Daily amount of Acetaminophen °Do not take more than  3,000 mg per day    ° ° °Day 2   ° °Time  °Name of Medication  Number of pills °taken  °Amount of Acetaminophen  °Pain Level  ° °Comments  °AM PM       °AM PM       °AM PM       °AM PM       °AM PM       °AM PM       °AM PM       °AM PM       °Total Daily amount of Acetaminophen °Do not take more than  3,000 mg per day    ° ° °Day 3   ° °Time  °Name of Medication Number of pills taken  °Amount of Acetaminophen  °Pain Level  ° °Comments  °AM PM       °AM PM       °AM PM       °AM PM       ° ° ° °AM PM       °AM PM       °AM PM       °AM PM       °Total Daily amount of Acetaminophen °Do not take more than  3,000 mg per day    ° ° °Day 4   ° °Time  °Name of Medication Number of pills taken  °Amount of Acetaminophen  °Pain Level  ° °Comments  °AM PM       °AM PM       °AM PM       °AM PM       °AM PM       °AM PM       °AM PM       °AM PM       °Total Daily amount of Acetaminophen °Do not take more than  3,000 mg per day    ° ° °Day 5   ° °Time  °Name of Medication Number °of pills taken  °Amount of Acetaminophen  °Pain Level  ° °Comments  °AM PM       °AM PM       °AM PM       °AM PM       °AM PM       °AM   PM       °AM PM       °AM PM       °Total Daily amount of Acetaminophen °Do not take more than  3,000 mg per day    ° ° ° °Day 6   ° °Time  °Name of Medication Number of pills °taken  °Amount of Acetaminophen  °Pain Level  °Comments  °AM PM       °AM PM       °AM PM       °AM PM       °AM PM       °AM PM       °AM PM       °AM PM       °Total Daily amount of Acetaminophen °Do not take more than  3,000 mg per day    ° ° °Day 7   ° °Time  °Name of Medication Number of pills taken  °Amount of Acetaminophen  °Pain Level  ° °Comments  °AM PM       °AM PM       °AM PM       °AM PM       °AM PM       °AM PM       °AM PM       °AM PM       °Total Daily amount of Acetaminophen °Do not take more than  3,000 mg per day    ° ° ° ° °For additional information about how and where to safely dispose of unused opioid °medications - https://www.morepowerfulnc.org ° °Disclaimer: This document  contains information and/or instructional materials adapted from [facility] for the typical patient with your condition. It does not replace medical advice from your health care provider because your experience may differ from that of the °typical patient. Talk to your health care provider if you have any questions about this °document, your condition or your treatment plan. °Adapted from [facility] ° °

## 2023-02-07 NOTE — Telephone Encounter (Signed)
Per Dr. Jena Gauss he will need to wait 3 months

## 2023-02-07 NOTE — Discharge Summary (Signed)
[facility] Discharge Summary   Patient ID: Russell Odom MRN: [id number] DOB/AGE: [birth year] 67 y.o.  Admit date: 02/05/2023 Discharge date: 02/07/2023  Admitting Diagnosis: Acute appendicitis [K35.80] Acute appendicitis with localized peritonitis, without perforation, abscess, or gangrene [K35.30]   Discharge Diagnosis Acute appendicitis [K35.80] Acute appendicitis with localized peritonitis, without perforation, abscess, or gangrene [K35.30]    Consultants None  Imaging: CT ABDOMEN PELVIS W CONTRAST  Result Date: 02/05/2023 CLINICAL DATA:  Right lower quadrant pain EXAM: CT ABDOMEN AND PELVIS WITH CONTRAST TECHNIQUE: Multidetector CT imaging of the abdomen and pelvis was performed using the standard protocol following bolus administration of intravenous contrast. RADIATION DOSE REDUCTION: This exam was performed according to the departmental dose-optimization program which includes automated exposure control, adjustment of the mA and/or kV according to patient size and/or use of iterative reconstruction technique. CONTRAST:  OMNIPAQUE IOHEXOL 300 MG/ML  SOLN COMPARISON:  CT abdomen and pelvis 08/10/2022 FINDINGS: Lower chest: No acute abnormality. Hepatobiliary: No focal liver abnormality is seen. No gallstones, gallbladder wall thickening, or biliary dilatation. Pancreas: Unremarkable. No pancreatic ductal dilatation or surrounding inflammatory changes. Spleen: Normal in size without focal abnormality. Adrenals/Urinary Tract: There is a nonobstructing 3 mm calculus in the left kidney. There is a left renal cyst measuring 15 mm. There is no hydronephrosis or perinephric fat stranding. The adrenal glands, right kidney and bladder are within normal limits. Stomach/Bowel: The appendix is dilated measuring 14 mm. There is wall thickening and surrounding inflammatory stranding. Appendicoliths is present. No evidence for perforation or abscess. There is no bowel  obstruction. There is sigmoid colon diverticulosis. Small bowel is within normal limits. There is a small hiatal hernia. There is wall thickening of the distal esophagus. The stomach is otherwise within normal limits. Vascular/Lymphatic: Aortic atherosclerosis. No enlarged abdominal or pelvic lymph nodes. Reproductive: Prostate gland is enlarged. Other: There are small fat containing inguinal and umbilical hernias. No ascites. Musculoskeletal: No fracture is seen. IMPRESSION: 1. Findings compatible with acute uncomplicated appendicitis. 2. Nonobstructing left renal calculus. 3. Colonic diverticulosis. 4. Small hiatal hernia with wall thickening of the distal esophagus. Correlate clinically for esophagitis. Aortic Atherosclerosis (ICD10-I70.0). Electronically Signed   By: Darliss Cheney M.D.   On: 02/05/2023 21:01    Procedures Dr. Dossie Der (02/06/23) - Laparoscopic Appendectomy  Hospital Course:  67 y.o. male who presented to the ED with abdominal pain.  Workup showed appendicitis.  Patient was admitted and underwent procedure listed above.  Tolerated procedure well and was transferred to the floor.  Diet was advanced as tolerated.  On POD1, the patient was voiding well, tolerating diet, ambulating well, pain well controlled, vital signs stable, incisions c/d/i and felt stable for discharge home.  Patient will follow up in our office in 3 weeks and knows to call with questions or concerns.  He will call to confirm appointment date/time.    Physical Exam: General:  Alert, NAD, pleasant, comfortable Abd:  Soft, ND, mild tenderness, incisions C/D/I  I or a member of my team have reviewed this patient in the Controlled Substance Database  Allergies as of 02/07/2023   No Known Allergies      Medication List     TAKE these medications    acetaminophen 500 MG tablet Commonly known as: TYLENOL Take 1,000 mg by mouth every 6 (six) hours as needed for moderate pain.   benazepril 20 MG  tablet Commonly known as: LOTENSIN TAKE 2 TABLETS (40 MG TOTAL) BY MOUTH 2 (TWO) TIMES DAILY. What changed:  See the new instructions.   diphenhydrAMINE 25 MG tablet Commonly known as: BENADRYL Take 25 mg by mouth daily as needed for itching.   dipyridamole-aspirin 200-25 MG 12hr capsule Commonly known as: AGGRENOX Take 1 capsule by mouth 2 (two) times daily. Do not resume until 4/30 What changed: additional instructions   Fish Oil 1000 MG Caps Take 1,000 mg by mouth 2 (two) times daily.   levothyroxine 50 MCG tablet Commonly known as: SYNTHROID Take 50 mcg by mouth daily.   multivitamins ther. w/minerals Tabs tablet Take 1 tablet by mouth daily.   oxyCODONE 5 MG immediate release tablet Commonly known as: Oxy IR/ROXICODONE Take 1 tablet (5 mg total) by mouth every 6 (six) hours as needed for up to 3 days for moderate pain or severe pain.   rosuvastatin 20 MG tablet Commonly known as: CRESTOR TAKE 1 TABLET BY MOUTH EVERY DAY          Follow-up Information     Maczis, Hedda Slade, PA-C. Call.   Specialty: General Surgery Why: We are making a follow up appointment for you., Please call to confirm appointment time., Arrive 30 minutes early to complete check in, and bring photo ID and insurance card. Contact information: [address] [place] [address] [facility] [place] [address] [phone]                 Signed: Eric Form , [facility] 02/07/2023, 10:44 AM Please see Amion for pager number during day hours 7:00am-4:30pm

## 2023-02-08 LAB — SURGICAL PATHOLOGY

## 2023-02-09 ENCOUNTER — Ambulatory Visit (HOSPITAL_COMMUNITY): Admission: RE | Admit: 2023-02-09 | Payer: PPO | Source: Home / Self Care | Admitting: Internal Medicine

## 2023-02-09 ENCOUNTER — Encounter (HOSPITAL_COMMUNITY): Admission: RE | Payer: Self-pay | Source: Home / Self Care

## 2023-02-09 SURGERY — COLONOSCOPY WITH PROPOFOL
Anesthesia: Monitor Anesthesia Care

## 2023-03-23 DIAGNOSIS — Z8673 Personal history of transient ischemic attack (TIA), and cerebral infarction without residual deficits: Secondary | ICD-10-CM | POA: Diagnosis not present

## 2023-03-23 DIAGNOSIS — E7800 Pure hypercholesterolemia, unspecified: Secondary | ICD-10-CM | POA: Diagnosis not present

## 2023-03-23 DIAGNOSIS — I700 Atherosclerosis of aorta: Secondary | ICD-10-CM | POA: Diagnosis not present

## 2023-03-23 DIAGNOSIS — E039 Hypothyroidism, unspecified: Secondary | ICD-10-CM | POA: Diagnosis not present

## 2023-03-23 DIAGNOSIS — Z6831 Body mass index (BMI) 31.0-31.9, adult: Secondary | ICD-10-CM | POA: Diagnosis not present

## 2023-05-04 ENCOUNTER — Encounter: Payer: Self-pay | Admitting: Internal Medicine

## 2023-05-11 ENCOUNTER — Telehealth: Payer: Self-pay | Admitting: *Deleted

## 2023-05-11 NOTE — Telephone Encounter (Signed)
Pt had left vm wanting to reschedule his colonoscopy due to having to cancel the one in May '24.  Pt had to have appendix taken out.  Informed pt will be receiving a new questionnaire and to fill that out and send it back to office, then we can get him scheduled. Informed pt Dr.Rourk is booking into September. Verbalized understanding.

## 2023-05-16 ENCOUNTER — Other Ambulatory Visit: Payer: Self-pay | Admitting: *Deleted

## 2023-05-24 MED ORDER — PEG 3350-KCL-NA BICARB-NACL 420 G PO SOLR: 4000.0000 mL | Freq: Once | ORAL | 0 refills | Status: AC

## 2023-05-24 NOTE — Addendum Note (Signed)
Addended by: Armstead Peaks on: 05/24/2023 01:18 PM   Modules accepted: Orders

## 2023-05-24 NOTE — Telephone Encounter (Signed)
Spoke with pt. Scheduled for 9/18. Aware will send instructions/pre-op appt. Rx sent to pharmacy.

## 2023-06-23 NOTE — Telephone Encounter (Signed)
Pt is on aggrenox. does he need to hold this medication? His procedure is on 9/18.  Please advise. Thank you

## 2023-06-24 ENCOUNTER — Encounter (HOSPITAL_COMMUNITY): Payer: Self-pay

## 2023-06-24 ENCOUNTER — Encounter (HOSPITAL_COMMUNITY)
Admission: RE | Admit: 2023-06-24 | Discharge: 2023-06-24 | Disposition: A | Discharge status: Home / Self Care | Payer: PPO | Source: Ambulatory Visit | Attending: Internal Medicine

## 2023-06-24 VITALS — BP 127/82 | HR 64 | Temp 98.4°F | Resp 18 | Ht 73.0 in | Wt 240.1 lb

## 2023-06-24 DIAGNOSIS — Z800 Family history of malignant neoplasm of digestive organs: Secondary | ICD-10-CM | POA: Diagnosis not present

## 2023-06-24 DIAGNOSIS — Z01810 Encounter for preprocedural cardiovascular examination: Secondary | ICD-10-CM | POA: Insufficient documentation

## 2023-06-24 NOTE — Patient Instructions (Signed)
Russell Odom  06/24/2023     @PREFPERIOPPHARMACY @   Your procedure is scheduled on 06/29/23.  Report to [facility] at 0700 A.M.  Call this number if you have problems the morning of surgery:  [phone]  If you experience any cold or flu symptoms such as cough, fever, chills, shortness of breath, etc. between now and your scheduled surgery, please notify us at the above number.   Remember:  Do not eat or drink after midnight.     Take these medicines the morning of surgery with A SIP OF WATER SYNTHROID    Do not wear jewelry, make-up or nail polish, including gel polish,  artificial nails, or any other type of covering on natural nails (fingers and  toes).  Do not wear lotions, powders, or perfumes, or deodorant.  Do not shave 48 hours prior to surgery.  Men may shave face and neck.  Do not bring valuables to the hospital.  [facility] is not responsible for any belongings or valuables.  Contacts, dentures or bridgework may not be worn into surgery.  Leave your suitcase in the car.  After surgery it may be brought to your room.  For patients admitted to the hospital, discharge time will be determined by your treatment team.  Patients discharged the day of surgery will not be allowed to drive home.   Name and phone number of your driver:   FAMILY Special instructions:  FOLLOW DIET & PREP INSTRUCTIONS PROVIDED TO YOU FROM OFFICE  Please read over the following fact sheets that you were given. Anesthesia Post-op Instructions and Care and Recovery After Surgery      PATIENT INSTRUCTIONS POST-ANESTHESIA  IMMEDIATELY FOLLOWING SURGERY:  Do not drive or operate machinery for the first twenty four hours after surgery.  Do not make any important decisions for twenty four hours after surgery or while taking narcotic pain medications or sedatives.  If you develop intractable nausea and vomiting or a severe headache please notify your doctor immediately.  FOLLOW-UP:  Please  make an appointment with your surgeon as instructed. You do not need to follow up with anesthesia unless specifically instructed to do so.  WOUND CARE INSTRUCTIONS (if applicable):  Keep a dry clean dressing on the anesthesia/puncture wound site if there is drainage.  Once the wound has quit draining you may leave it open to air.  Generally you should leave the bandage intact for twenty four hours unless there is drainage.  If the epidural site drains for more than 36-48 hours please call the anesthesia department.  QUESTIONS?:  Please feel free to call your physician or the hospital operator if you have any questions, and they will be happy to assist you.      Colonoscopy, Adult A colonoscopy is a procedure to look at the entire large intestine. This procedure is done using a long, thin, flexible tube that has a camera on the end. You may have a colonoscopy: As a part of normal colorectal screening. If you have certain symptoms, such as: A low number of red blood cells in your blood (anemia). Diarrhea that does not go away. Pain in your abdomen. Blood in your stool. A colonoscopy can help screen for and diagnose medical problems, including: An abnormal growth of cells or tissue (tumor). Abnormal growths within the lining of your intestine (polyps). Inflammation. Areas of bleeding. Tell your health care provider about: Any allergies you have. All medicines you are taking, including vitamins, herbs, eye drops, creams,  and over-the-counter medicines. Any problems you or family members have had with anesthetic medicines. Any bleeding problems you have. Any surgeries you have had. Any medical conditions you have. Any problems you have had with having bowel movements. Whether you are pregnant or may be pregnant. What are the risks? Generally, this is a safe procedure. However, problems may occur, including: Bleeding. Damage to your intestine. Allergic reactions to medicines given during  the procedure. Infection. This is rare. What happens before the procedure? Eating and drinking restrictions Follow instructions from your health care provider about eating or drinking restrictions, which may include: A few days before the procedure: Follow a low-fiber diet. Avoid nuts, seeds, dried fruit, raw fruits, and vegetables. 1-3 days before the procedure: Eat only gelatin dessert or ice pops. Drink only clear liquids, such as water, clear juice, clear broth or bouillon, black coffee or tea, or clear soft drinks or sports drinks. Avoid liquids that contain red or purple dye. The day of the procedure: Do not eat solid foods. You may continue to drink clear liquids until up to 2 hours before the procedure. Do not eat or drink anything starting 2 hours before the procedure, or within the time period that your health care provider recommends. Bowel prep If you were prescribed a bowel prep to take by mouth (orally) to clean out your colon: Take it as told by your health care provider. Starting the day before your procedure, you will need to drink a large amount of liquid medicine. The liquid will cause you to have many bowel movements of loose stool until your stool becomes almost clear or light green. If your skin or the opening between the buttocks (anus) gets irritated from diarrhea, you may relieve the irritation using: Wipes with medicine in them, such as adult wet wipes with aloe and vitamin E. A product to soothe skin, such as petroleum jelly. If you vomit while drinking the bowel prep: Take a break for up to 60 minutes. Begin the bowel prep again. Call your health care provider if you keep vomiting or you cannot take the bowel prep without vomiting. To clean out your colon, you may also be given: Laxative medicines. These help you have a bowel movement. Instructions for enema use. An enema is liquid medicine injected into your rectum. Medicines Ask your health care provider  about: Changing or stopping your regular medicines or supplements. This is especially important if you are taking iron supplements, diabetes medicines, or blood thinners. Taking medicines such as aspirin and ibuprofen. These medicines can thin your blood. Do not take these medicines unless your health care provider tells you to take them. Taking over-the-counter medicines, vitamins, herbs, and supplements. General instructions Ask your health care provider what steps will be taken to help prevent infection. These may include washing skin with a germ-killing soap. If you will be going home right after the procedure, plan to have a responsible adult: Take you home from the hospital or clinic. You will not be allowed to drive. Care for you for the time you are told. What happens during the procedure?  An IV will be inserted into one of your veins. You will be given a medicine to make you fall asleep (general anesthetic). You will lie on your side with your knees bent. A lubricant will be put on the tube. Then the tube will be: Inserted into your anus. Gently eased through all parts of your large intestine. Air will be sent into your colon to  keep it open. This may cause some pressure or cramping. Images will be taken with the camera and will appear on a screen. A small tissue sample may be removed to be looked at under a microscope (biopsy). The tissue may be sent to a lab for testing if any signs of problems are found. If small polyps are found, they may be removed and checked for cancer cells. When the procedure is finished, the tube will be removed. The procedure may vary among health care providers and hospitals. What happens after the procedure? Your blood pressure, heart rate, breathing rate, and blood oxygen level will be monitored until you leave the hospital or clinic. You may have a small amount of blood in your stool. You may pass gas and have mild cramping or bloating in your  abdomen. This is caused by the air that was used to open your colon during the exam. If you were given a sedative during the procedure, it can affect you for several hours. Do not drive or operate machinery until your health care provider says that it is safe. It is up to you to get the results of your procedure. Ask your health care provider, or the department that is doing the procedure, when your results will be ready. Summary A colonoscopy is a procedure to look at the entire large intestine. Follow instructions from your health care provider about eating and drinking before the procedure. If you were prescribed an oral bowel prep to clean out your colon, take it as told by your health care provider. During the colonoscopy, a flexible tube with a camera on its end is inserted into the anus and then passed into all parts of the large intestine. This information is not intended to replace advice given to you by your health care provider. Make sure you discuss any questions you have with your health care provider. Document Revised: 11/09/2022 Document Reviewed: 05/20/2021 Elsevier Patient Education  2024 [employer] Colonoscopy, Adult, Care After The following information offers guidance on how to care for yourself after your procedure. Your health care provider may also give you more specific instructions. If you have problems or questions, contact your health care provider. What can I expect after the procedure? After the procedure, it is common to have: A small amount of blood in your stool for 24 hours after the procedure. Some gas. Mild cramping or bloating of your abdomen. Follow these instructions at home: Eating and drinking  Drink enough fluid to keep your urine pale yellow. Follow instructions from your health care provider about eating or drinking restrictions. Resume your normal diet as told by your health care provider. Avoid heavy or fried foods that are hard to  digest. Activity Rest as told by your health care provider. Avoid sitting for a long time without moving. Get up to take short walks every 1-2 hours. This is important to improve blood flow and breathing. Ask for help if you feel weak or unsteady. Return to your normal activities as told by your health care provider. Ask your health care provider what activities are safe for you. Managing cramping and bloating  Try walking around when you have cramps or feel bloated. If directed, apply heat to your abdomen as told by your health care provider. Use the heat source that your health care provider recommends, such as a moist heat pack or a heating pad. Place a towel between your skin and the heat source. Leave the heat on for 20-30 minutes.  Remove the heat if your skin turns bright red. This is especially important if you are unable to feel pain, heat, or cold. You have a greater risk of getting burned. General instructions If you were given a sedative during the procedure, it can affect you for several hours. Do not drive or operate machinery until your health care provider says that it is safe. For the first 24 hours after the procedure: Do not sign important documents. Do not drink alcohol. Do your regular daily activities at a slower pace than normal. Eat soft foods that are easy to digest. Take over-the-counter and prescription medicines only as told by your health care provider. Keep all follow-up visits. This is important. Contact a health care provider if: You have blood in your stool 2-3 days after the procedure. Get help right away if: You have more than a small spotting of blood in your stool. You have large blood clots in your stool. You have swelling of your abdomen. You have nausea or vomiting. You have a fever. You have increasing pain in your abdomen that is not relieved with medicine. These symptoms may be an emergency. Get help right away. Call 911. Do not wait to see if  the symptoms will go away. Do not drive yourself to the hospital. Summary After the procedure, it is common to have a small amount of blood in your stool. You may also have mild cramping and bloating of your abdomen. If you were given a sedative during the procedure, it can affect you for several hours. Do not drive or operate machinery until your health care provider says that it is safe. Get help right away if you have a lot of blood in your stool, nausea or vomiting, a fever, or increased pain in your abdomen. This information is not intended to replace advice given to you by your health care provider. Make sure you discuss any questions you have with your health care provider. Document Revised: 11/09/2022 Document Reviewed: 05/20/2021 Elsevier Patient Education  2024 [employer].

## 2023-06-24 NOTE — Telephone Encounter (Signed)
Advised Erroll Luna, RN per Dr.Rourk it's ok to continue Aggrenox.

## 2023-06-29 ENCOUNTER — Ambulatory Visit (HOSPITAL_COMMUNITY): Payer: PPO | Admitting: Anesthesiology

## 2023-06-29 ENCOUNTER — Encounter (HOSPITAL_COMMUNITY)
Admission: RE | Disposition: A | Discharge status: Home / Self Care | Payer: Self-pay | Source: Home / Self Care | Attending: Internal Medicine

## 2023-06-29 ENCOUNTER — Ambulatory Visit (HOSPITAL_COMMUNITY)
Admission: RE | Admit: 2023-06-29 | Discharge: 2023-06-29 | Disposition: A | Discharge status: Home / Self Care | Payer: PPO | Attending: Internal Medicine | Admitting: Internal Medicine

## 2023-06-29 ENCOUNTER — Encounter (HOSPITAL_COMMUNITY): Payer: Self-pay | Admitting: Internal Medicine

## 2023-06-29 DIAGNOSIS — K5730 Diverticulosis of large intestine without perforation or abscess without bleeding: Secondary | ICD-10-CM

## 2023-06-29 DIAGNOSIS — Z800 Family history of malignant neoplasm of digestive organs: Secondary | ICD-10-CM | POA: Insufficient documentation

## 2023-06-29 DIAGNOSIS — E039 Hypothyroidism, unspecified: Secondary | ICD-10-CM | POA: Diagnosis not present

## 2023-06-29 DIAGNOSIS — I10 Essential (primary) hypertension: Secondary | ICD-10-CM | POA: Diagnosis not present

## 2023-06-29 DIAGNOSIS — Z8673 Personal history of transient ischemic attack (TIA), and cerebral infarction without residual deficits: Secondary | ICD-10-CM | POA: Insufficient documentation

## 2023-06-29 DIAGNOSIS — Z1211 Encounter for screening for malignant neoplasm of colon: Secondary | ICD-10-CM | POA: Insufficient documentation

## 2023-06-29 DIAGNOSIS — Z86010 Personal history of colonic polyps: Secondary | ICD-10-CM | POA: Insufficient documentation

## 2023-06-29 HISTORY — PX: COLONOSCOPY WITH PROPOFOL: SHX5780

## 2023-06-29 SURGERY — COLONOSCOPY WITH PROPOFOL
Anesthesia: General

## 2023-06-29 MED ORDER — LACTATED RINGERS IV SOLN: INTRAVENOUS | Status: DC | PRN

## 2023-06-29 MED ORDER — PROPOFOL 10 MG/ML IV BOLUS
INTRAVENOUS | Status: DC | PRN
Start: 2023-06-29 — End: 2023-06-29
  Administered 2023-06-29: 100 mg via INTRAVENOUS

## 2023-06-29 MED ORDER — PROPOFOL 500 MG/50ML IV EMUL
INTRAVENOUS | Status: DC | PRN
  Administered 2023-06-29: 150 ug/kg/min via INTRAVENOUS

## 2023-06-29 NOTE — H&P (Signed)
@LOGO @   Primary Care Physician:  Jackelyn Poling, DO Primary Gastroenterologist:  Dr. Jena Gauss  Pre-Procedure History & Physical: HPI:  Russell Odom is a 67 y.o. male here for high risk screening colonoscopy.  Sister with colon cancer.  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   History of esophageal dilatation    History of kidney stones    HTN (hypertension)    Hypothyroid    Mixed hyperlipidemia    Stroke [facility])     Past Surgical History:  Procedure Laterality Date   BIOPSY  02/01/2018   Procedure: BIOPSY;  Surgeon: Corbin Ade, MD;  Location: [facility];  Service: Endoscopy;;  ileocecal valve   COLONOSCOPY    07/04/2003   RMR: anal canal hemorrhoids, normal colon   COLONOSCOPY  11/02/2012   Procedure: COLONOSCOPY;  Surgeon: Corbin Ade, MD;  Location: [facility];  Service: Endoscopy;  Laterality: N/A;  9:30   COLONOSCOPY N/A 02/01/2018   Procedure: COLONOSCOPY;  Surgeon: Corbin Ade, MD;  Location: [facility];  Service: Endoscopy;  Laterality: N/A;  9:45   ESOPHAGOGASTRODUODENOSCOPY  07/04/2003   RMR: Multiple linear esophageal erosions consistent with mild-to-moderate  erosive reflux esophagitis, status post passage of a 67 F Maloney dilator/ Multiple antral and duodenal bulbar erosions; otherwise, the remainder of the stomach and the first and second portions of the duodenum appeared normal   EXTRACORPOREAL SHOCK WAVE LITHOTRIPSY Right 11/13/2018   Procedure: EXTRACORPOREAL SHOCK WAVE LITHOTRIPSY (ESWL);  Surgeon: Crista Elliot, MD;  Location: [facility] ORS;  Service: Urology;  Laterality: Right;   EXTRACORPOREAL SHOCK WAVE LITHOTRIPSY Right 01/29/2019   Procedure: EXTRACORPOREAL SHOCK  WAVE LITHOTRIPSY (ESWL);  Surgeon: Crista Elliot, MD;  Location: [facility] OR[facility];  Service: Urology;  Laterality: Right;   LAPAROSCOPIC APPENDECTOMY N/A 02/06/2023   Procedure: APPENDECTOMY LAPAROSCOPIC;  Surgeon: Quentin Ore, MD;  Location: [facility] OR;  Service: General;  Laterality: N/A;   LUMBAR LAMINECTOMY/DECOMPRESSION MICRODISCECTOMY Left 04/09/2020   Procedure: Lumbar four through lumbar five Discectomy left;  Surgeon: Venita Lick, MD;  Location: [facility] OR;  Service: Orthopedics;  Laterality: Left;  2 hrs    Prior to Admission medications   Medication Sig Start Date End Date Taking? Authorizing Provider  acetaminophen (TYLENOL) 500 MG tablet Take 1,000 mg by mouth every 6 (six) hours as needed for moderate pain.   Yes [provider]  aspirin EC 81 MG tablet Take 81 mg by mouth daily. 03/23/23  Yes [provider]  benazepril (LOTENSIN) 20 MG tablet TAKE 2 TABLETS (40 MG TOTAL) BY MOUTH 2 (TWO) TIMES DAILY. Patient taking differently: Take 40 mg by mouth 2 (two) times daily. 09/09/20  Yes Yates Decamp, MD  diphenhydrAMINE (BENADRYL) 25 MG tablet Take 25 mg by mouth daily as needed for itching.   Yes [provider]  levothyroxine (SYNTHROID) 50 MCG tablet Take 50 mcg by mouth daily.   Yes [provider]  Multiple Vitamins-Minerals (MULTIVITAMINS THER. W/MINERALS) TABS Take 1 tablet by mouth daily.   Yes [provider]  Omega-3 Fatty Acids (FISH OIL) 1000 MG CAPS Take 1,000 mg by mouth 2 (two) times daily.   Yes [provider]  rosuvastatin (  CRESTOR) 20 MG tablet TAKE 1 TABLET BY MOUTH EVERY DAY Patient taking differently: Take 20 mg by mouth daily. 06/08/21  Yes Patwardhan, Manish J, MD  dipyridamole-aspirin (AGGRENOX) 200-25 MG 12hr capsule Take 1 capsule by mouth 2 (two) times daily. Do not resume until 4/30 02/07/23   Eric Form, PA-C    Allergies as of 05/24/2023   (No Known Allergies)    Family History  Problem Relation  Age of Onset   Colon cancer Sister 41    Social History   Socioeconomic History   Marital status: Married    Spouse name: Eber Jones    Number of children: 3   Years of education: 12   Highest education level: Not on file  Occupational History   Occupation: Merchandiser, retail at [employer]   Occupation: SUPERVISOR     Employer: [employer]  Tobacco Use   Smoking status: Never   Smokeless tobacco: Never  Vaping Use   Vaping status: Never Used  Substance and Sexual Activity   Alcohol use: No   Drug use: No   Sexual activity: Not on file  Other Topics Concern   Not on file  Social History Narrative   Patient lives at home with wife Eber Jones.    Patient has 3 daughters.    Patient has a high school education.    Patient is right haneded.    Patient is working at [employer].    Social Determinants of Health   Financial Resource Strain: Not on file  Food Insecurity: Not on file  Transportation Needs: Not on file  Physical Activity: Not on file  Stress: Not on file  Social Connections: Not on file  Intimate Partner Violence: Not on file    Review of Systems: See HPI, otherwise negative ROS  Physical Exam: BP (!) 146/86   Pulse 67   Temp (!) 97.5 F (36.4 C)   Resp 18   Ht 6\' 1"  (1.854 m)   Wt 108.9 kg   SpO2 97%   BMI 31.67 kg/m  General:   Alert,  Well-developed, well-nourished, pleasant and cooperative in NAD Neck:  Supple; no masses or thyromegaly. No significant cervical adenopathy. Lungs:  Clear throughout to auscultation.   No wheezes, crackles, or rhonchi. No acute distress. Heart:  Regular rate and rhythm; no murmurs, clicks, rubs,  or gallops. Abdomen: Non-distended, normal bowel sounds.  Soft and nontender without appreciable mass or hepatosplenomegaly.   Impression/Plan: 67 year old gentleman with a positive family history colon cancer first-degree relative personal history of colonic polyps.  Negative colonoscopy 2019. Patient is here for a  colonoscopy per plan.  The risks, benefits, limitations, alternatives and imponderables have been reviewed with the patient. Questions have been answered. All parties are agreeable.       Notice: This dictation was prepared with Dragon dictation along with smaller phrase technology. Any transcriptional errors that result from this process are unintentional and may not be corrected upon review.

## 2023-06-29 NOTE — Anesthesia Preprocedure Evaluation (Signed)
Anesthesia Evaluation  Patient identified by MRN, date of birth, ID band Patient awake    Reviewed: Allergy & Precautions, H&P , NPO status , Patient's Chart, lab work & pertinent test results, reviewed documented beta blocker date and time   Airway Mallampati: II  TM Distance: >3 FB Neck ROM: full    Dental no notable dental hx.    Pulmonary neg pulmonary ROS   Pulmonary exam normal breath sounds clear to auscultation       Cardiovascular Exercise Tolerance: Good hypertension, negative cardio ROS  Rhythm:regular Rate:Normal     Neuro/Psych CVA, Residual Symptoms negative neurological ROS  negative psych ROS   GI/Hepatic negative GI ROS, Neg liver ROS,,,  Endo/Other  negative endocrine ROSHypothyroidism    Renal/GU negative Renal ROS  negative genitourinary   Musculoskeletal   Abdominal   Peds  Hematology negative hematology ROS (+)   Anesthesia Other Findings   Reproductive/Obstetrics negative OB ROS                             Anesthesia Physical Anesthesia Plan  ASA: 3  Anesthesia Plan: General   Post-op Pain Management:    Induction:   PONV Risk Score and Plan: Propofol infusion  Airway Management Planned:   Additional Equipment:   Intra-op Plan:   Post-operative Plan:   Informed Consent: I have reviewed the patients History and Physical, chart, labs and discussed the procedure including the risks, benefits and alternatives for the proposed anesthesia with the patient or authorized representative who has indicated his/her understanding and acceptance.     Dental Advisory Given  Plan Discussed with: CRNA  Anesthesia Plan Comments:        Anesthesia Quick Evaluation

## 2023-06-29 NOTE — Discharge Instructions (Signed)
  Colonoscopy Discharge Instructions  Read the instructions outlined below and refer to this sheet in the next few weeks. These discharge instructions provide you with general information on caring for yourself after you leave the hospital. Your doctor may also give you specific instructions. While your treatment has been planned according to the most current medical practices available, unavoidable complications occasionally occur. If you have any problems or questions after discharge, call Dr. Jena Gauss at [phone]. ACTIVITY You may resume your regular activity, but move at a slower pace for the next 24 hours.  Take frequent rest periods for the next 24 hours.  Walking will help get rid of the air and reduce the bloated feeling in your belly (abdomen).  No driving for 24 hours (because of the medicine (anesthesia) used during the test).   Do not sign any important legal documents or operate any machinery for 24 hours (because of the anesthesia used during the test).  NUTRITION Drink plenty of fluids.  You may resume your normal diet as instructed by your doctor.  Begin with a light meal and progress to your normal diet. Heavy or fried foods are harder to digest and may make you feel sick to your stomach (nauseated).  Avoid alcoholic beverages for 24 hours or as instructed.  MEDICATIONS You may resume your normal medications unless your doctor tells you otherwise.  WHAT YOU CAN EXPECT TODAY Some feelings of bloating in the abdomen.  Passage of more gas than usual.  Spotting of blood in your stool or on the toilet paper.  IF YOU HAD POLYPS REMOVED DURING THE COLONOSCOPY: No aspirin products for 7 days or as instructed.  No alcohol for 7 days or as instructed.  Eat a soft diet for the next 24 hours.  FINDING OUT THE RESULTS OF YOUR TEST Not all test results are available during your visit. If your test results are not back during the visit, make an appointment with your caregiver to find out the  results. Do not assume everything is normal if you have not heard from your caregiver or the medical facility. It is important for you to follow up on all of your test results.  SEEK IMMEDIATE MEDICAL ATTENTION IF: You have more than a spotting of blood in your stool.  Your belly is swollen (abdominal distention).  You are nauseated or vomiting.  You have a temperature over 101.  You have abdominal pain or discomfort that is severe or gets worse throughout the day.       No polyps found today; only diverticulosis.  Diverticulosis information provided  Recommend repeat colonoscopy in 5 years   at patient request, I called Juliet Rude at [phone]-reviewed findings and recommendations

## 2023-06-29 NOTE — Transfer of Care (Signed)
Immediate Anesthesia Transfer of Care Note  Patient: Russell Odom  Procedure(s) Performed: COLONOSCOPY WITH PROPOFOL  Patient Location: Short Stay  Anesthesia Type:General  Level of Consciousness: awake, drowsy, and patient cooperative  Airway & Oxygen Therapy: Patient Spontanous Breathing  Post-op Assessment: Report given to RN, Post -op Vital signs reviewed and stable, and Patient moving all extremities X 4  Post vital signs: Reviewed and stable  Last Vitals:  Vitals Value Taken Time  BP    Temp 36.9 C 06/29/23 1055  Pulse 65 06/29/23 1055  Resp 22 06/29/23 1055  SpO2 96 % 06/29/23 1055    Last Pain:  Vitals:   06/29/23 1055  TempSrc: Oral  PainSc: 0-No pain      Patients Stated Pain Goal: 5 (06/29/23 0802)  Complications: No notable events documented.

## 2023-06-29 NOTE — Op Note (Signed)
[facility] Patient Name: Russell Odom Procedure Date: 06/29/2023 9:44 AM MRN: [id number] Date of Birth: November 09, 1955 Attending MD: Gennette Pac , MD, [id number] CSN: [id number] Age: 67 Admit Type: Outpatient Procedure:                Colonoscopy Indications:              Screening in patient at increased risk: Family                            history of 1st-degree relative with colorectal                            cancer Providers:                Gennette Pac, MD, Angelica Ran, Dyann Ruddle Referring MD:              Medicines:                Propofol per Anesthesia Complications:            No immediate complications. Estimated Blood Loss:     Estimated blood loss: none. Procedure:                Pre-Anesthesia Assessment:                           - Prior to the procedure, a History and Physical                            was performed, and patient medications and                            allergies were reviewed. The patient's tolerance of                            previous anesthesia was also reviewed. The risks                            and benefits of the procedure and the sedation                            options and risks were discussed with the patient.                            All questions were answered, and informed consent                            was obtained. Prior Anticoagulants: The patient has                            taken no anticoagulant or antiplatelet agents. ASA                            Grade Assessment: III - A patient with severe  systemic disease. After reviewing the risks and                            benefits, the patient was deemed in satisfactory                            condition to undergo the procedure.                           After obtaining informed consent, the colonoscope                            was passed under direct vision. Throughout the                             procedure, the patient's blood pressure, pulse, and                            oxygen saturations were monitored continuously. The                            [id number]) scope was introduced through the                            anus and advanced to the the cecum, identified by                            appendiceal orifice and ileocecal valve. The                            colonoscopy was performed without difficulty. The                            patient tolerated the procedure well. The quality                            of the bowel preparation was adequate. The entire                            colon was well visualized. Scope In: 10:36:59 AM Scope Out: 10:49:48 AM Scope Withdrawal Time: 0 hours 9 minutes 0 seconds  Total Procedure Duration: 0 hours 12 minutes 49 seconds  Findings:      The perianal and digital rectal examinations were normal.      Scattered medium-mouthed diverticula were found in the sigmoid colon and       descending colon.      The exam was otherwise without abnormality on direct and retroflexion       views. Impression:               - Diverticulosis in the sigmoid colon and in the                            descending colon.                           -  The examination was otherwise normal on direct                            and retroflexion views.                           - No specimens collected. Moderate Sedation:      Moderate (conscious) sedation was personally administered by an       anesthesia professional. The following parameters were monitored: oxygen       saturation, heart rate, blood pressure, respiratory rate, EKG, adequacy       of pulmonary ventilation, and response to care. Recommendation:           - Patient has a contact number available for                            emergencies. The signs and symptoms of potential                            delayed complications were discussed with the                            patient.  Return to normal activities tomorrow.                            Written discharge instructions were provided to the                            patient.                           - Resume previous diet.                           - Continue present medications.                           - Repeat colonoscopy in 5 years for screening                            purposes.                           - Return to GI office (date not yet determined). Procedure Code(s):        --- Professional ---                           [phone], Colonoscopy, flexible; diagnostic, including                            collection of specimen(s) by brushing or washing,                            when performed (separate procedure) Diagnosis Code(s):        --- Professional ---  Z80.0, Family history of malignant neoplasm of                            digestive organs                           K57.30, Diverticulosis of large intestine without                            perforation or abscess without bleeding CPT copyright 2022 [employer]. All rights reserved. The codes documented in this report are preliminary and upon coder review may  be revised to meet current compliance requirements. Gerrit Friends. Aidan Caloca, MD Gennette Pac, MD 06/29/2023 11:11:24 AM This report has been signed electronically. Number of Addenda: 0

## 2023-07-07 ENCOUNTER — Encounter (HOSPITAL_COMMUNITY): Payer: Self-pay | Admitting: Internal Medicine

## 2023-07-08 NOTE — Anesthesia Postprocedure Evaluation (Signed)
Anesthesia Post Note  Patient: Russell Odom  Procedure(s) Performed: COLONOSCOPY WITH PROPOFOL  Patient location during evaluation: Phase II Anesthesia Type: General Level of consciousness: awake Pain management: pain level controlled Vital Signs Assessment: post-procedure vital signs reviewed and stable Respiratory status: spontaneous breathing and respiratory function stable Cardiovascular status: blood pressure returned to baseline and stable Postop Assessment: no headache and no apparent nausea or vomiting Anesthetic complications: no Comments: Late entry   No notable events documented.   Last Vitals:  Vitals:   06/29/23 1102 06/29/23 1105  BP: 93/67 109/80  Pulse:    Resp:    Temp:    SpO2:      Last Pain:  Vitals:   06/29/23 1055  TempSrc: Oral  PainSc: 0-No pain                 Windell Norfolk

## 2023-09-21 DIAGNOSIS — E039 Hypothyroidism, unspecified: Secondary | ICD-10-CM | POA: Diagnosis not present

## 2023-09-21 DIAGNOSIS — Z0000 Encounter for general adult medical examination without abnormal findings: Secondary | ICD-10-CM | POA: Diagnosis not present

## 2023-09-21 DIAGNOSIS — I10 Essential (primary) hypertension: Secondary | ICD-10-CM | POA: Diagnosis not present

## 2023-09-21 DIAGNOSIS — Z1159 Encounter for screening for other viral diseases: Secondary | ICD-10-CM | POA: Diagnosis not present

## 2023-09-21 DIAGNOSIS — Z125 Encounter for screening for malignant neoplasm of prostate: Secondary | ICD-10-CM | POA: Diagnosis not present

## 2023-09-21 DIAGNOSIS — M545 Low back pain, unspecified: Secondary | ICD-10-CM | POA: Diagnosis not present

## 2023-09-21 DIAGNOSIS — Z23 Encounter for immunization: Secondary | ICD-10-CM | POA: Diagnosis not present

## 2023-09-21 DIAGNOSIS — H6121 Impacted cerumen, right ear: Secondary | ICD-10-CM | POA: Diagnosis not present

## 2023-09-21 DIAGNOSIS — E7800 Pure hypercholesterolemia, unspecified: Secondary | ICD-10-CM | POA: Diagnosis not present

## 2024-02-19 ENCOUNTER — Emergency Department (HOSPITAL_BASED_OUTPATIENT_CLINIC_OR_DEPARTMENT_OTHER): Admitting: Radiology

## 2024-02-19 ENCOUNTER — Encounter (HOSPITAL_BASED_OUTPATIENT_CLINIC_OR_DEPARTMENT_OTHER): Payer: Self-pay

## 2024-02-19 ENCOUNTER — Other Ambulatory Visit: Payer: Self-pay

## 2024-02-19 ENCOUNTER — Emergency Department (HOSPITAL_BASED_OUTPATIENT_CLINIC_OR_DEPARTMENT_OTHER): Admission: EM | Admit: 2024-02-19 | Discharge: 2024-02-19 | Disposition: A | Discharge status: Home / Self Care

## 2024-02-19 DIAGNOSIS — W57XXXA Bitten or stung by nonvenomous insect and other nonvenomous arthropods, initial encounter: Secondary | ICD-10-CM | POA: Diagnosis not present

## 2024-02-19 DIAGNOSIS — I700 Atherosclerosis of aorta: Secondary | ICD-10-CM | POA: Diagnosis not present

## 2024-02-19 DIAGNOSIS — R7401 Elevation of levels of liver transaminase levels: Secondary | ICD-10-CM | POA: Insufficient documentation

## 2024-02-19 DIAGNOSIS — R059 Cough, unspecified: Secondary | ICD-10-CM | POA: Diagnosis not present

## 2024-02-19 DIAGNOSIS — S20469A Insect bite (nonvenomous) of unspecified back wall of thorax, initial encounter: Secondary | ICD-10-CM | POA: Insufficient documentation

## 2024-02-19 DIAGNOSIS — R5383 Other fatigue: Secondary | ICD-10-CM | POA: Insufficient documentation

## 2024-02-19 DIAGNOSIS — R531 Weakness: Secondary | ICD-10-CM | POA: Insufficient documentation

## 2024-02-19 DIAGNOSIS — S30861A Insect bite (nonvenomous) of abdominal wall, initial encounter: Secondary | ICD-10-CM | POA: Diagnosis not present

## 2024-02-19 DIAGNOSIS — Z7982 Long term (current) use of aspirin: Secondary | ICD-10-CM | POA: Diagnosis not present

## 2024-02-19 LAB — CBC WITH DIFFERENTIAL/PLATELET
Abs Immature Granulocytes: 0.01 10*3/uL (ref 0.00–0.07)
Basophils Absolute: 0 10*3/uL (ref 0.0–0.1)
Basophils Relative: 1 %
Eosinophils Absolute: 0 10*3/uL (ref 0.0–0.5)
Eosinophils Relative: 0 %
HCT: 44.9 % (ref 39.0–52.0)
Hemoglobin: 15.5 g/dL (ref 13.0–17.0)
Immature Granulocytes: 0 %
Lymphocytes Relative: 28 %
Lymphs Abs: 0.9 10*3/uL (ref 0.7–4.0)
MCH: 30.1 pg (ref 26.0–34.0)
MCHC: 34.5 g/dL (ref 30.0–36.0)
MCV: 87.2 fL (ref 80.0–100.0)
Monocytes Absolute: 0.4 10*3/uL (ref 0.1–1.0)
Monocytes Relative: 11 %
Neutro Abs: 2 10*3/uL (ref 1.7–7.7)
Neutrophils Relative %: 60 %
Platelets: 153 10*3/uL (ref 150–400)
RBC: 5.15 MIL/uL (ref 4.22–5.81)
RDW: 14.5 % (ref 11.5–15.5)
WBC: 3.3 10*3/uL — ABNORMAL LOW (ref 4.0–10.5)
nRBC: 0 % (ref 0.0–0.2)

## 2024-02-19 LAB — URINALYSIS, ROUTINE W REFLEX MICROSCOPIC
Bacteria, UA: NONE SEEN
Bilirubin Urine: NEGATIVE
Glucose, UA: NEGATIVE mg/dL
Leukocytes,Ua: NEGATIVE
Nitrite: NEGATIVE
Specific Gravity, Urine: 1.025 (ref 1.005–1.030)
pH: 6 (ref 5.0–8.0)

## 2024-02-19 LAB — COMPREHENSIVE METABOLIC PANEL WITH GFR
ALT: 53 U/L — ABNORMAL HIGH (ref 0–44)
AST: 77 U/L — ABNORMAL HIGH (ref 15–41)
Albumin: 4.4 g/dL (ref 3.5–5.0)
Alkaline Phosphatase: 84 U/L (ref 38–126)
Anion gap: 11 (ref 5–15)
BUN: 13 mg/dL (ref 8–23)
CO2: 24 mmol/L (ref 22–32)
Calcium: 9.8 mg/dL (ref 8.9–10.3)
Chloride: 103 mmol/L (ref 98–111)
Creatinine, Ser: 1.13 mg/dL (ref 0.61–1.24)
GFR, Estimated: >60 mL/min (ref >60)
Glucose, Bld: 119 mg/dL — ABNORMAL HIGH (ref 70–99)
Potassium: 4.2 mmol/L (ref 3.5–5.1)
Sodium: 137 mmol/L (ref 135–145)
Total Bilirubin: 0.9 mg/dL (ref 0.0–1.2)
Total Protein: 7 g/dL (ref 6.5–8.1)

## 2024-02-19 MED ORDER — DOXYCYCLINE HYCLATE 100 MG PO TABS
100.0000 mg | ORAL_TABLET | Freq: Once | ORAL | Status: AC
  Administered 2024-02-19: 100 mg via ORAL
  Filled 2024-02-19: qty 1

## 2024-02-19 MED ORDER — DOXYCYCLINE HYCLATE 100 MG PO CAPS: 100.0000 mg | ORAL_CAPSULE | Freq: Two times a day (BID) | ORAL | 0 refills | Status: DC

## 2024-02-19 NOTE — ED Triage Notes (Signed)
 Patient arrives ambulatory to the ED with complaints of several tic bites over the past 2 weeks. Patient reports rash to left side as well, with fatigue. Tics have been removed prior to arrival.

## 2024-02-19 NOTE — Discharge Instructions (Signed)
 Please read and follow all provided instructions.  Your diagnoses today include:  1. Generalized weakness   2. Tick bite, unspecified site, initial encounter   3. Elevated transaminase level     Tests performed today include: Complete blood cell count: Your white blood cell count was slightly low, otherwise no problems Complete metabolic panel: Your liver function tests were slightly high, otherwise no problems EKG: No changes or signs of stress on the heart Vital signs. See below for your results today.   Medications prescribed:  Doxycycline - antibiotic  You have been prescribed an antibiotic medicine: take the entire course of medicine even if you are feeling better. Stopping early can cause the antibiotic not to work.  This antibiotic will make you very sensitive to the sun.  You need to wear sunscreen and wear protective clothing if outdoors for any prolonged period of time.  Take any prescribed medications only as directed.  Home care instructions:  Follow any educational materials contained in this packet.  BE VERY CAREFUL not to take multiple medicines containing Tylenol  (also called acetaminophen ). Doing so can lead to an overdose which can damage your liver and cause liver failure and possibly death.   Follow-up instructions: Please follow-up with your primary care provider in the next 7 days for further evaluation of your symptoms.   Return instructions:  Please return to the Emergency Department if you experience worsening symptoms.  Return with high fever, persistent vomiting, worsening pain or other concerns. Please return if you have any other emergent concerns.  Additional Information:  Your vital signs today were: BP 128/74   Pulse 81   Temp 99.1 F (37.3 C) (Oral)   Resp 20   Ht 6\' 1"  (1.854 m)   Wt 108.9 kg   SpO2 96%   BMI 31.66 kg/m  If your blood pressure (BP) was elevated above 135/85 this visit, please have this repeated by your doctor within  one month. --------------

## 2024-02-19 NOTE — ED Provider Notes (Signed)
  EMERGENCY DEPARTMENT AT [facility] Provider Note   CSN: [id number] Arrival date & time: 02/19/24  0947     History  Chief Complaint  Patient presents with   Fatigue   Tic Bite    Russell Odom is a 68 y.o. male.  Patient presents to the emergency department today for evaluation of fatigue, cough, multiple recent tick bites.  Patient has pulled 5 ticks off of him in the past 2 weeks.  The symptoms concerning him today have been ongoing for 2 days.  Patient has had decreased energy and shaking chills at night.  Tmax was 100 F.  No nausea, vomiting, or diarrhea.  He does report a rash on his left side at one of the tick bite locations.  Otherwise he has had some localized itching at other tick bite locations.  No chest pain or shortness of breath.  Cough is occasional, not associated with nasal congestion, runny nose, sore throat.  It is nonproductive.  Recently the patient did have an episode of lower abdominal pain that lasted about 12 hours, resolved with Pepto-Bismol.  Not currently present.  No blood in the stool or black stools.  No urinary symptoms.  Family member reports COVID testing at home yesterday, was negative.       Home Medications Prior to Admission medications   Medication Sig Start Date End Date Taking? Authorizing Provider  acetaminophen  (TYLENOL ) 500 MG tablet Take 1,000 mg by mouth every 6 (six) hours as needed for moderate pain.    [provider]  aspirin  EC 81 MG tablet Take 81 mg by mouth daily. 03/23/23   [provider]  benazepril  (LOTENSIN ) 20 MG tablet TAKE 2 TABLETS (40 MG TOTAL) BY MOUTH 2 (TWO) TIMES DAILY. Patient taking differently: Take 40 mg by mouth 2 (two) times daily. 09/09/20   Knox Perl, MD  diphenhydrAMINE  (BENADRYL ) 25 MG tablet Take 25 mg by mouth daily as needed for itching.    [provider]  dipyridamole -aspirin  (AGGRENOX ) 200-25 MG 12hr capsule Take 1 capsule by mouth 2 (two) times  daily. Do not resume until 4/30 02/07/23   Elwin Hammond, PA-C  levothyroxine  (SYNTHROID ) 50 MCG tablet Take 50 mcg by mouth daily.    [provider]  Multiple Vitamins-Minerals (MULTIVITAMINS THER. W/MINERALS) TABS Take 1 tablet by mouth daily.    [provider]  Omega-3 Fatty Acids (FISH OIL) 1000 MG CAPS Take 1,000 mg by mouth 2 (two) times daily.    [provider]  rosuvastatin  (CRESTOR ) 20 MG tablet TAKE 1 TABLET BY MOUTH EVERY DAY Patient taking differently: Take 20 mg by mouth daily. 06/08/21   Patwardhan, Kaye Parsons, MD      Allergies    Patient has no known allergies.    Review of Systems   Review of Systems  Physical Exam Updated Vital Signs BP 135/86 (BP Location: Right Arm)   Pulse 85   Temp 99.1 F (37.3 C) (Oral)   Resp 20   Ht 6\' 1"  (1.854 m)   Wt 108.9 kg   SpO2 98%   BMI 31.66 kg/m  Physical Exam Vitals and nursing note reviewed.  Constitutional:      General: He is not in acute distress.    Appearance: He is well-developed.  HENT:     Head: Normocephalic and atraumatic.     Right Ear: External ear normal.     Left Ear: External ear normal.     Nose: Nose normal.  Mouth/Throat:     Mouth: Mucous membranes are moist.  Eyes:     General:        Right eye: No discharge.        Left eye: No discharge.     Conjunctiva/sclera: Conjunctivae normal.  Cardiovascular:     Rate and Rhythm: Normal rate and regular rhythm.     Heart sounds: Normal heart sounds.  Pulmonary:     Effort: Pulmonary effort is normal.     Breath sounds: Normal breath sounds. No wheezing, rhonchi or rales.  Abdominal:     Palpations: Abdomen is soft.     Tenderness: There is no abdominal tenderness. There is no guarding or rebound.  Musculoskeletal:     Cervical back: Normal range of motion and neck supple.  Skin:    General: Skin is warm and dry.     Comments: Patient with tick bite locations across the torso, abdomen and back.  Patient does have  a macular rash, not classic for erythema migrans, left flank.  There is no bull's-eye type appearance.  At other locations, no significant rash.  Neurological:     Mental Status: He is alert.     ED Results / Procedures / Treatments   Labs (all labs ordered are listed, but only abnormal results are displayed) Labs Reviewed  CBC WITH DIFFERENTIAL/PLATELET - Abnormal; Notable for the following components:      Result Value   WBC 3.3 (*)    All other components within normal limits  COMPREHENSIVE METABOLIC PANEL WITH GFR - Abnormal; Notable for the following components:   Glucose, Bld 119 (*)    AST 77 (*)    ALT 53 (*)    All other components within normal limits  URINALYSIS, ROUTINE W REFLEX MICROSCOPIC - Abnormal; Notable for the following components:   Hgb urine dipstick SMALL (*)    Ketones, ur TRACE (*)    Protein, ur TRACE (*)    All other components within normal limits  ROCKY MTN SPOTTED FVR ABS PNL(IGG+IGM)  LYME DISEASE SEROLOGY W/REFLEX   ED ECG REPORT   Date: 02/19/2024  Rate: 77  Rhythm: normal sinus rhythm  QRS Axis: right  Intervals: normal  ST/T Wave abnormalities: normal  Conduction Disutrbances:none  Narrative Interpretation:   Old EKG Reviewed: unchanged from 06/2023 except axis   I have personally reviewed the EKG tracing and agree with the computerized printout as noted.   Radiology DG Chest 2 View Result Date: 02/19/2024 CLINICAL DATA:  cough, fatigue EXAM: CHEST - 2 VIEW COMPARISON:  04/09/2020. FINDINGS: Cardiac silhouette is unremarkable. No pneumothorax or pleural effusion. The lungs are clear. Aorta is calcified. The visualized skeletal structures are unremarkable. IMPRESSION: No acute cardiopulmonary process. Electronically Signed   By: Sydell Eva M.D.   On: 02/19/2024 10:24    Procedures Procedures    Medications Ordered in ED Medications  doxycycline (VIBRA-TABS) tablet 100 mg (100 mg Oral Given 02/19/24 1140)    ED Course/  Medical Decision Making/ A&P    Patient seen and examined. History obtained directly from patient and family member at bedside.  Patient has generalized fatigue, cough, in setting of recent tick bites.  Labs/EKG: Ordered CBC, CMP, UA.  Will send RMSF and Lyme testing.  Will check EKG, however symptoms are not overly concerning for ACS.  Imaging: Ordered chest x-ray.  Medications/Fluids: None ordered  Most recent vital signs reviewed and are as follows: BP 135/86 (BP Location: Right Arm)   Pulse 85  Temp 99.1 F (37.3 C) (Oral)   Resp 20   Ht 6\' 1"  (1.854 m)   Wt 108.9 kg   SpO2 98%   BMI 31.66 kg/m   Initial impression: Fatigue and cough, recent tick bites.  Will evaluate for other potential etiologies such as pneumonia.  If reassuring, will place patient on doxycycline for possible tickborne illness.  11:49 AM Reassessment performed. Patient appears stable, comfortable.  Labs personally reviewed and interpreted including: CBC with mild leukopenia, borderline platelet count; CMP mild ovation in transaminases, slightly elevated glucose.  UA without signs of infection.  Imaging personally visualized and interpreted including: Chest x-ray agree negative.  Reviewed pertinent lab work and imaging with patient at bedside. Questions answered.  Discussed that Lyme and RMSF titers will take several days before they return.  Most current vital signs reviewed and are as follows: BP 128/74   Pulse 81   Temp 99.1 F (37.3 C) (Oral)   Resp 20   Ht 6\' 1"  (1.854 m)   Wt 108.9 kg   SpO2 96%   BMI 31.66 kg/m   Plan: Discharge to home.  Will give first dose of doxycycline here.  Prescriptions written for: Doxycycline 100 mg p.o. x 7 days  Other home care instructions discussed: We discussed photosensitivity effects of doxycycline and need to protect the skin if outside.  ED return instructions discussed: Return with new or worsening symptoms including fever, vomiting, expanding  rash.  Follow-up instructions discussed: Patient encouraged to follow-up with their PCP in 7 days.  He does state he has follow-up scheduled in a couple of weeks.                                Medical Decision Making Amount and/or Complexity of Data Reviewed Labs: ordered. Radiology: ordered.  Risk Prescription drug management.   Patient with fatigue and cough in setting of recent tick bites.  Screening lab work largely unremarkable.  Mild leukopenia and elevation in liver function testing may indicate infection.  Given high suspicion for tickborne illness, patient started on doxycycline and Lyme/RMSF titers sent.  Patient looks well, nontoxic.  He is appropriate for discharged home with close outpatient follow-up.  The patient's vital signs, pertinent lab work and imaging were reviewed and interpreted as discussed in the ED course. Hospitalization was considered for further testing, treatments, or serial exams/observation. However as patient is well-appearing, has a stable exam, and reassuring studies today, I do not feel that they warrant admission at this time. This plan was discussed with the patient who verbalizes agreement and comfort with this plan and seems reliable and able to return to the Emergency Department with worsening or changing symptoms.          Final Clinical Impression(s) / ED Diagnoses Final diagnoses:  Generalized weakness  Tick bite, unspecified site, initial encounter  Elevated transaminase level    Rx / DC Orders ED Discharge Orders     None         Lyna Sandhoff, PA-C 02/19/24 1152    Rolinda Climes, DO 02/19/24 1247

## 2024-02-19 NOTE — ED Notes (Signed)
 Patient transported to X-ray

## 2024-02-20 LAB — LYME DISEASE SEROLOGY W/REFLEX: Lyme Total Antibody EIA: NEGATIVE

## 2024-03-06 DIAGNOSIS — I10 Essential (primary) hypertension: Secondary | ICD-10-CM | POA: Diagnosis not present

## 2024-03-10 DIAGNOSIS — E039 Hypothyroidism, unspecified: Secondary | ICD-10-CM | POA: Diagnosis not present

## 2024-03-10 DIAGNOSIS — I10 Essential (primary) hypertension: Secondary | ICD-10-CM | POA: Diagnosis not present

## 2024-03-10 DIAGNOSIS — E7800 Pure hypercholesterolemia, unspecified: Secondary | ICD-10-CM | POA: Diagnosis not present

## 2024-03-16 DIAGNOSIS — E7800 Pure hypercholesterolemia, unspecified: Secondary | ICD-10-CM | POA: Diagnosis not present

## 2024-03-16 DIAGNOSIS — I10 Essential (primary) hypertension: Secondary | ICD-10-CM | POA: Diagnosis not present

## 2024-03-16 DIAGNOSIS — E039 Hypothyroidism, unspecified: Secondary | ICD-10-CM | POA: Diagnosis not present

## 2024-03-16 DIAGNOSIS — Z125 Encounter for screening for malignant neoplasm of prostate: Secondary | ICD-10-CM | POA: Diagnosis not present

## 2024-03-16 DIAGNOSIS — E790 Hyperuricemia without signs of inflammatory arthritis and tophaceous disease: Secondary | ICD-10-CM | POA: Diagnosis not present

## 2024-03-21 DIAGNOSIS — E790 Hyperuricemia without signs of inflammatory arthritis and tophaceous disease: Secondary | ICD-10-CM | POA: Diagnosis not present

## 2024-03-21 DIAGNOSIS — E039 Hypothyroidism, unspecified: Secondary | ICD-10-CM | POA: Diagnosis not present

## 2024-03-21 DIAGNOSIS — I10 Essential (primary) hypertension: Secondary | ICD-10-CM | POA: Diagnosis not present

## 2024-03-21 DIAGNOSIS — E7800 Pure hypercholesterolemia, unspecified: Secondary | ICD-10-CM | POA: Diagnosis not present

## 2024-03-21 DIAGNOSIS — Z6833 Body mass index (BMI) 33.0-33.9, adult: Secondary | ICD-10-CM | POA: Diagnosis not present

## 2024-03-26 ENCOUNTER — Other Ambulatory Visit (HOSPITAL_BASED_OUTPATIENT_CLINIC_OR_DEPARTMENT_OTHER): Payer: Self-pay

## 2024-03-26 ENCOUNTER — Other Ambulatory Visit: Payer: Self-pay

## 2024-03-26 ENCOUNTER — Emergency Department (HOSPITAL_BASED_OUTPATIENT_CLINIC_OR_DEPARTMENT_OTHER)
Admission: EM | Admit: 2024-03-26 | Discharge: 2024-03-26 | Disposition: A | Discharge status: Home / Self Care | Attending: Emergency Medicine | Admitting: Emergency Medicine

## 2024-03-26 ENCOUNTER — Encounter (HOSPITAL_BASED_OUTPATIENT_CLINIC_OR_DEPARTMENT_OTHER): Payer: Self-pay | Admitting: Emergency Medicine

## 2024-03-26 ENCOUNTER — Emergency Department (HOSPITAL_BASED_OUTPATIENT_CLINIC_OR_DEPARTMENT_OTHER)

## 2024-03-26 DIAGNOSIS — Z7982 Long term (current) use of aspirin: Secondary | ICD-10-CM | POA: Diagnosis not present

## 2024-03-26 DIAGNOSIS — Z79899 Other long term (current) drug therapy: Secondary | ICD-10-CM | POA: Diagnosis not present

## 2024-03-26 DIAGNOSIS — Z8673 Personal history of transient ischemic attack (TIA), and cerebral infarction without residual deficits: Secondary | ICD-10-CM | POA: Diagnosis not present

## 2024-03-26 DIAGNOSIS — N201 Calculus of ureter: Secondary | ICD-10-CM

## 2024-03-26 DIAGNOSIS — N132 Hydronephrosis with renal and ureteral calculous obstruction: Secondary | ICD-10-CM | POA: Insufficient documentation

## 2024-03-26 DIAGNOSIS — R1032 Left lower quadrant pain: Secondary | ICD-10-CM | POA: Diagnosis not present

## 2024-03-26 DIAGNOSIS — E039 Hypothyroidism, unspecified: Secondary | ICD-10-CM | POA: Diagnosis not present

## 2024-03-26 DIAGNOSIS — K5730 Diverticulosis of large intestine without perforation or abscess without bleeding: Secondary | ICD-10-CM | POA: Diagnosis not present

## 2024-03-26 DIAGNOSIS — I10 Essential (primary) hypertension: Secondary | ICD-10-CM | POA: Insufficient documentation

## 2024-03-26 LAB — CBC
HCT: 44.4 % (ref 39.0–52.0)
Hemoglobin: 15.2 g/dL (ref 13.0–17.0)
MCH: 29.6 pg (ref 26.0–34.0)
MCHC: 34.2 g/dL (ref 30.0–36.0)
MCV: 86.5 fL (ref 80.0–100.0)
Platelets: 204 10*3/uL (ref 150–400)
RBC: 5.13 MIL/uL (ref 4.22–5.81)
RDW: 14.2 % (ref 11.5–15.5)
WBC: 10.8 10*3/uL — ABNORMAL HIGH (ref 4.0–10.5)
nRBC: 0 % (ref 0.0–0.2)

## 2024-03-26 LAB — COMPREHENSIVE METABOLIC PANEL WITH GFR
ALT: 25 U/L (ref 0–44)
AST: 41 U/L (ref 15–41)
Albumin: 4.2 g/dL (ref 3.5–5.0)
Alkaline Phosphatase: 76 U/L (ref 38–126)
Anion gap: 14 (ref 5–15)
BUN: 14 mg/dL (ref 8–23)
CO2: 21 mmol/L — ABNORMAL LOW (ref 22–32)
Calcium: 9.8 mg/dL (ref 8.9–10.3)
Chloride: 102 mmol/L (ref 98–111)
Creatinine, Ser: 1.2 mg/dL (ref 0.61–1.24)
GFR, Estimated: >60 mL/min (ref >60)
Glucose, Bld: 140 mg/dL — ABNORMAL HIGH (ref 70–99)
Potassium: 5.1 mmol/L (ref 3.5–5.1)
Sodium: 138 mmol/L (ref 135–145)
Total Bilirubin: 0.7 mg/dL (ref 0.0–1.2)
Total Protein: 7.3 g/dL (ref 6.5–8.1)

## 2024-03-26 LAB — URINALYSIS, ROUTINE W REFLEX MICROSCOPIC
Bacteria, UA: NONE SEEN
Bilirubin Urine: NEGATIVE
Glucose, UA: NEGATIVE mg/dL
Leukocytes,Ua: NEGATIVE
Nitrite: NEGATIVE
Protein, ur: NEGATIVE mg/dL
RBC / HPF: >50 RBC/hpf (ref 0–5)
Specific Gravity, Urine: 1.01 (ref 1.005–1.030)
pH: 5.5 (ref 5.0–8.0)

## 2024-03-26 LAB — LIPASE, BLOOD: Lipase: 23 U/L (ref 11–51)

## 2024-03-26 MED ORDER — ONDANSETRON HCL 4 MG/2ML IJ SOLN
4.0000 mg | Freq: Once | INTRAMUSCULAR | Status: AC
  Administered 2024-03-26: 4 mg via INTRAVENOUS
  Filled 2024-03-26: qty 2

## 2024-03-26 MED ORDER — OXYCODONE HCL 5 MG PO TABS
5.0000 mg | ORAL_TABLET | ORAL | 0 refills | Status: DC | PRN
  Filled 2024-03-26 (×2): qty 15, 3d supply, fill #0

## 2024-03-26 MED ORDER — TAMSULOSIN HCL 0.4 MG PO CAPS
0.4000 mg | ORAL_CAPSULE | Freq: Every day | ORAL | 0 refills | Status: AC
  Filled 2024-03-26: qty 14, 14d supply, fill #0

## 2024-03-26 MED ORDER — IOHEXOL 300 MG/ML  SOLN
100.0000 mL | Freq: Once | INTRAMUSCULAR | Status: AC | PRN
  Administered 2024-03-26: 100 mL via INTRAVENOUS

## 2024-03-26 MED ORDER — TAMSULOSIN HCL 0.4 MG PO CAPS: 0.4000 mg | ORAL_CAPSULE | Freq: Every day | ORAL | 0 refills | Status: DC

## 2024-03-26 MED ORDER — OXYCODONE HCL 5 MG PO TABS: 5.0000 mg | ORAL_TABLET | ORAL | 0 refills | Status: DC | PRN

## 2024-03-26 MED ORDER — ONDANSETRON 4 MG PO TBDP
4.0000 mg | ORAL_TABLET | Freq: Three times a day (TID) | ORAL | 0 refills | Status: DC | PRN
  Filled 2024-03-26: qty 20, 7d supply, fill #0

## 2024-03-26 MED ORDER — ONDANSETRON 4 MG PO TBDP: 4.0000 mg | ORAL_TABLET | Freq: Three times a day (TID) | ORAL | 0 refills | Status: DC | PRN

## 2024-03-26 MED ORDER — MORPHINE SULFATE (PF) 4 MG/ML IV SOLN
4.0000 mg | Freq: Once | INTRAVENOUS | Status: AC
  Administered 2024-03-26: 4 mg via INTRAVENOUS
  Filled 2024-03-26: qty 1

## 2024-03-26 NOTE — ED Provider Notes (Signed)
 [facility] AT [facility] Provider Note   CSN: [id number] Arrival date & time: 03/26/24  2130     Patient presents with: Abdominal Pain   Russell Odom is a 68 y.o. male.   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 Authorizing Provider  acetaminophen  (TYLENOL ) 500 MG tablet Take 1,000 mg by mouth Odom 6 (six) hours as needed for moderate pain.    [provider]  aspirin  EC 81 MG tablet Take 81 mg by mouth daily. 03/23/23   [provider]  benazepril  (LOTENSIN ) 20 MG tablet TAKE 2 TABLETS (40 MG TOTAL) BY MOUTH 2 (TWO) TIMES DAILY. Patient taking differently: Take 40 mg by mouth 2 (two) times daily. 09/09/20   Knox Perl, MD  diphenhydrAMINE  (BENADRYL ) 25 MG tablet Take 25 mg by mouth daily as needed for itching.    [provider]  dipyridamole -aspirin  (AGGRENOX ) 200-25 MG 12hr capsule Take 1 capsule by mouth 2 (two) times daily. Do not resume until 4/30 02/07/23   Elwin Hammond, PA-C  doxycycline   (VIBRAMYCIN ) 100 MG capsule Take 1 capsule (100 mg total) by mouth 2 (two) times daily. 02/19/24   Geiple, Joshua, PA-C  levothyroxine  (SYNTHROID ) 50 MCG tablet Take 50 mcg by mouth daily.    [provider]  Multiple Vitamins-Minerals (MULTIVITAMINS THER. W/MINERALS) TABS Take 1 tablet by mouth daily.    [provider]  Omega-3 Fatty Acids (FISH OIL) 1000 MG CAPS Take 1,000 mg by mouth 2 (two) times daily.    [provider]  ondansetron  (ZOFRAN -ODT) 4 MG disintegrating tablet Take 1 tablet (4 mg total) by mouth Odom 8 (eight) hours as needed for nausea or vomiting. 03/26/24   Scarlette Currier, MD  oxyCODONE  (ROXICODONE ) 5 MG immediate release tablet Take 1 tablet (5 mg total) by mouth Odom 4 (four) hours as needed for severe pain (pain score 7-10). 03/26/24   Scarlette Currier, MD  rosuvastatin  (CRESTOR ) 20 MG tablet TAKE 1 TABLET BY MOUTH Odom DAY Patient taking differently: Take 20 mg by mouth daily. 06/08/21   Patwardhan, Kaye Parsons, MD  tamsulosin  (FLOMAX ) 0.4 MG CAPS capsule Take 1 capsule (0.4 mg total) by mouth daily for 14 days. 03/26/24 04/09/24  Scarlette Currier, MD    Allergies: Patient has no known allergies.    Review of Systems  Updated Vital Signs BP (!) 155/81   Pulse 69   Temp 98.2 F (36.8 C) (Oral)  Resp 18   Ht 6' 1 (1.854 m)   Wt 108.9 kg   SpO2 96%   BMI 31.67 kg/m   Physical Exam Vitals and nursing note reviewed.  Constitutional:      General: He is not in acute distress.    Appearance: He is well-developed. He is not diaphoretic.  HENT:     Head: Normocephalic and atraumatic.   Eyes:     Conjunctiva/sclera: Conjunctivae normal.    Cardiovascular:     Rate and Rhythm: Normal rate and regular rhythm.  Pulmonary:     Effort: Pulmonary effort is normal. No respiratory distress.  Abdominal:     General: There is no distension.     Palpations: Abdomen is soft.     Tenderness: There is abdominal tenderness (left groin). There is  no guarding.   Musculoskeletal:     Cervical back: Normal range of motion.   Skin:    General: Skin is warm and dry.   Neurological:     Mental Status: He is alert and oriented to person, place, and time.     (all labs ordered are listed, but only abnormal results are displayed) Labs Reviewed  COMPREHENSIVE METABOLIC PANEL WITH GFR - Abnormal; Notable for the following components:      Result Value   CO2 21 (*)    Glucose, Bld 140 (*)    All other components within normal limits  CBC - Abnormal; Notable for the following components:   WBC 10.8 (*)    All other components within normal limits  URINALYSIS, ROUTINE W REFLEX MICROSCOPIC - Abnormal; Notable for the following components:   Color, Urine RED (*)    Hgb urine dipstick LARGE (*)    Ketones, ur TRACE (*)    All other components within normal limits  LIPASE, BLOOD    EKG: None  Radiology: CT ABDOMEN PELVIS W CONTRAST Result Date: 03/26/2024 CLINICAL DATA:  Hernia suspected, inguinal or femoral vs kidney stone Acute onset of left lower quadrant abdominal pain this morning. Hematuria a couple days ago. EXAM: CT ABDOMEN AND PELVIS WITH CONTRAST TECHNIQUE: Multidetector CT imaging of the abdomen and pelvis was performed using the standard protocol following bolus administration of intravenous contrast. RADIATION DOSE REDUCTION: This exam was performed according to the departmental dose-optimization program which includes automated exposure control, adjustment of the mA and/or kV according to patient size and/or use of iterative reconstruction technique. CONTRAST:  OMNIPAQUE  IOHEXOL  300 MG/ML  SOLN COMPARISON:  Abdominopelvic CT 02/05/2023. FINDINGS: Lower chest: Minimal dependent atelectasis at both lung bases. No significant pleural or pericardial effusion. There is mild distal esophageal wall thickening which appears unchanged. Hepatobiliary: The liver is normal in density without suspicious focal abnormality. No evidence  of gallstones, gallbladder wall thickening or biliary dilatation. Pancreas: Unremarkable. No pancreatic ductal dilatation or surrounding inflammatory changes. Spleen: Normal in size without focal abnormality. Adrenals/Urinary Tract: Both adrenal glands appear normal. Punctate nonobstructing bilateral renal calculi. There is asymmetric perinephric soft tissue stranding, delayed contrast excretion and moderate hydronephrosis on the left secondary to an obstructing calculus in the distal left ureter. This is located approximately 3 cm proximal to the ureterovesical junction and measures up to 10 mm in length on sagittal image 86/5. The bladder appears unremarkable for its degree of distention. Stomach/Bowel: No enteric contrast administered. As above, distal esophageal wall thickening. The stomach appears unremarkable for its degree of distention. No evidence of bowel distension, wall thickening or surrounding inflammation. Retrocecal surgical clips suggesting interval  appendectomy. Sigmoid diverticulosis noted. Vascular/Lymphatic: There are no enlarged abdominal or pelvic lymph nodes. Small lymph nodes in the porta hepatis are stable. Partially calcified mass anterior to the descending colon and measuring 2.4 x 2.0 cm on image 54/2 is unchanged. This appears stable from 08/10/2022 CT and was similar in size but noncalcified on a CT performed 10/25/2018. Aortic and branch vessel atherosclerosis without evidence of aneurysm or large vessel occlusion. Reproductive: Stable mild enlargement of the prostate gland. Other: Stable small umbilical and inguinal hernias containing only fat. No ascites or pneumoperitoneum. Musculoskeletal: No acute or significant osseous findings. Mild spondylosis. IMPRESSION: 1. Moderate left hydronephrosis and delayed contrast excretion secondary to an obstructing 10 mm calculus in the distal left ureter. 2. Additional punctate nonobstructing bilateral renal calculi. 3. 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 4. Stable mild distal esophageal wall thickening, possibly esophagitis. 5.  Aortic Atherosclerosis (ICD10-I70.0). Electronically Signed   By: Elmon Hagedorn M.D.   On: 03/26/2024 09:23     Procedures   Medications Ordered in the ED  ondansetron  (ZOFRAN ) injection 4 mg (4 mg Intravenous Given 03/26/24 0837)  iohexol  (OMNIPAQUE ) 300 MG/ML solution 100 mL (100 mLs Intravenous Contrast Given 03/26/24 0855)  morphine  (PF) 4 MG/ML injection 4 mg (4 mg Intravenous Given 03/26/24 0939)                                     67yo male with history of nephrolithiasis, hyperlipidemia, CVA, esophageal dilation, who presents with concern for left groin pain.   DDx includes hernia, nephrolithiasis, pyelonephritis, torsion, diverticulitis.  Labs completed and personally evaluated by me show mild leukocytosis, no anemia, no clinically significant electrolyte abnormalities, no transaminitis. UA with hematuria without infection.  CT with contrast ordered given groin tenderness shows moderate left hydronephrosis secondary to obstructing 10mm calculus in distal left ureter.  Additinoal punctate nonobstructing bilateral renal calculi, stable mass descending colon, likely an incidentl indolent mesenchymal lesion, stable esophageal thickening possible esophagitis.  Discussed with Dr. [facility], who recommends calling Urology in AM to set up close appointment and talk about treatment options for large stone.    His pain is controlled, no AKI, no sign of infection. Given oxycodone , zofran  and flomax . Patient discharged in stable condition with understanding of reasons to return.      Final diagnoses:  Ureterolithiasis    ED Discharge Orders          Ordered    oxyCODONE  (ROXICODONE ) 5 MG immediate release tablet  Odom 4 hours PRN,   Status:  Discontinued        03/26/24 1228    ondansetron   (ZOFRAN -ODT) 4 MG disintegrating tablet  Odom 8 hours PRN,   Status:  Discontinued        03/26/24 1228    tamsulosin  (FLOMAX ) 0.4 MG CAPS capsule  Daily,   Status:  Discontinued        03/26/24 1228    ondansetron  (ZOFRAN -ODT) 4 MG disintegrating tablet  Odom 8 hours PRN        03/26/24 1238    oxyCODONE  (ROXICODONE ) 5 MG immediate release tablet  Odom 4 hours PRN        03/26/24 1238    tamsulosin  (FLOMAX ) 0.4 MG CAPS capsule  Daily        03/26/24 1238  Scarlette Currier, MD 03/26/24 1751

## 2024-03-26 NOTE — ED Triage Notes (Signed)
 Pt via pov from home with LLQ abdominal pain since 0330. States the pain is constant and dull. Pt reports normal urination this morning, other that a little slow. He also reports having some blood in his urine a few days ago and a creamy color urine on Saturday. Pt reports hx of kidney stones. States this pain is localized and does not radiate or hurt in the back. Pt a&o x 4; nad noted.

## 2024-03-26 NOTE — Discharge Instructions (Signed)
 For your pain, you may take up to 1000mg  of acetaminophen  (tylenol ) 4 times daily for up to a week. This is the maximum dose of acetminophen (tylenol ) you can take from all sources. Please check other over-the-counter medications and prescriptions to ensure you are not taking other medications that contain acetaminophen .  Given you might be a candidate for lithotripsy, avoid taking NSAIDs like naproxen, ibuprofen  at this time.  Take oxycodone  as needed for breakthrough pain.  This medication can be addicting, sedating and cause constipation.

## 2024-03-29 DIAGNOSIS — N201 Calculus of ureter: Secondary | ICD-10-CM | POA: Diagnosis not present

## 2024-04-04 DIAGNOSIS — I10 Essential (primary) hypertension: Secondary | ICD-10-CM | POA: Diagnosis not present

## 2024-04-09 DIAGNOSIS — E039 Hypothyroidism, unspecified: Secondary | ICD-10-CM | POA: Diagnosis not present

## 2024-04-09 DIAGNOSIS — I10 Essential (primary) hypertension: Secondary | ICD-10-CM | POA: Diagnosis not present

## 2024-04-09 DIAGNOSIS — E7800 Pure hypercholesterolemia, unspecified: Secondary | ICD-10-CM | POA: Diagnosis not present

## 2024-04-12 DIAGNOSIS — N201 Calculus of ureter: Secondary | ICD-10-CM | POA: Diagnosis not present

## 2024-04-16 ENCOUNTER — Other Ambulatory Visit: Payer: Self-pay | Admitting: Urology

## 2024-04-17 ENCOUNTER — Encounter (HOSPITAL_COMMUNITY): Payer: Self-pay | Admitting: Urology

## 2024-04-17 NOTE — Progress Notes (Signed)
 Spoke w/ via phone for pre-op interview---Patient Lab needs dos----  KUB       Lab results------ COVID test ----Not indicated -patient states asymptomatic no test needed Arrive at -------0915 NPO after MN NO Solid Food.  Clear liquids from MN until---0615 Pre-Surgery Ensure or G2:  Med rec completed Medications to take morning of surgery -----Synthroid , crestor , oxy, tylenol , benazepril , zofran , , benadryl  if needed Diabetic medication -----  GLP1 agonist last dose: GLP1 instructions:  Patient instructed no nail polish to be worn day of surgery Patient instructed to bring photo id and insurance card day of surgery Patient aware to have Driver (ride ) / caregiver    for 24 hours after surgery - spouseGLENWOOD Coy [phone] Patient Special Instructions -----Bring blue folder, driver's license, insurance card, Take laxative of choice on Thursday, eat light meal that evening and drink plenty so well hydrated. Pre-Op special Instructions -----Per [facility] and [facility]  Patient verbalized understanding of instructions that were given at this phone interview. Patient denies chest pain, sob, fever, cough at the interview.

## 2024-04-19 NOTE — H&P (Signed)
 H&P   Chief Complaint: Left ureteral calculus   History of Present Illness: 32 M w/ 2 L ureteral stones. Started having symptoms on 7/1. Stone are distal and associated with gorin pain . Pt is here for management with L ESWL.   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   History of esophageal dilatation    History of kidney stones    HTN (hypertension)    Hypothyroid    Mixed hyperlipidemia    Stroke [facility])    Past Surgical History:  Procedure Laterality Date   BIOPSY  02/01/2018   Procedure: BIOPSY;  Surgeon: Shaaron Lamar HERO, MD;  Location: [facility];  Service: Endoscopy;;  ileocecal valve   COLONOSCOPY    07/04/2003   RMR: anal canal hemorrhoids, normal colon   COLONOSCOPY  11/02/2012   Procedure: COLONOSCOPY;  Surgeon: Lamar HERO Shaaron, MD;  Location: [facility];  Service: Endoscopy;  Laterality: N/A;  9:30   COLONOSCOPY N/A 02/01/2018   Procedure: COLONOSCOPY;  Surgeon: Shaaron Lamar HERO, MD;  Location: [facility];  Service: Endoscopy;  Laterality: N/A;  9:45   COLONOSCOPY WITH PROPOFOL  N/A 06/29/2023   Procedure: COLONOSCOPY WITH PROPOFOL ;  Surgeon: Shaaron Lamar HERO, MD;  Location: [facility];  Service: Endoscopy;  Laterality: N/A;  900am, asa 3   ESOPHAGOGASTRODUODENOSCOPY  07/04/2003   RMR: Multiple linear esophageal erosions consistent with mild-to-moderate  erosive reflux esophagitis, status post passage of a 42 F Maloney dilator/ Multiple antral and duodenal bulbar erosions; otherwise, the remainder of the stomach and the first and second portions of the duodenum appeared normal   EXTRACORPOREAL SHOCK WAVE LITHOTRIPSY Right 11/13/2018   Procedure: EXTRACORPOREAL SHOCK WAVE  LITHOTRIPSY (ESWL);  Surgeon: Carolee Sherwood JONETTA DOUGLAS, MD;  Location: [facility] ORS;  Service: Urology;  Laterality: Right;   EXTRACORPOREAL SHOCK WAVE LITHOTRIPSY Right 01/29/2019   Procedure: EXTRACORPOREAL SHOCK WAVE LITHOTRIPSY (ESWL);  Surgeon: Carolee Sherwood JONETTA DOUGLAS, MD;  Location: [facility] OR[facility];  Service: Urology;  Laterality: Right;   LAPAROSCOPIC APPENDECTOMY N/A 02/06/2023   Procedure: APPENDECTOMY LAPAROSCOPIC;  Surgeon: Lyndel Deward PARAS, MD;  Location: [facility] OR;  Service: General;  Laterality: N/A;   LUMBAR LAMINECTOMY/DECOMPRESSION MICRODISCECTOMY Left 04/09/2020   Procedure: Lumbar four through lumbar five Discectomy left;  Surgeon: Burnetta Aures, MD;  Location: [facility] OR;  Service: Orthopedics;  Laterality: Left;  2 hrs    Home Medications:  No medications prior to admission.   Allergies: No Known Allergies  Family History  Problem Relation Age of Onset   Colon cancer Sister 7   Social History:  reports that he has never smoked. He has never used smokeless tobacco. He reports that he does not drink alcohol and does not use drugs.  ROS: A complete review of systems was performed.  All systems are negative except for pertinent findings as noted. ROS   Physical Exam:  Vital signs in last 24 hours:   General:  Alert and oriented, No acute distress HEENT: Normocephalic, atraumatic Neck: No JVD or lymphadenopathy Cardiovascular: Regular rate and rhythm Lungs: Regular rate and effort Abdomen: Soft, nontender, nondistended, no abdominal masses Back: No CVA tenderness Extremities: No edema Neurologic: Grossly intact  Laboratory Data:  No results found for this or any previous visit (from the past 24 hours). No results found for this or any previous visit (from the past 240 hours). Creatinine: No results for input(s): CREATININE in the last 168 hours.  Impression/Assessment:  1 M w/ 2 L ureteral stones. Started having symptoms on 7/1. Stone are distal and associated with gorin pain . Pt is  here for management with L ESWL.   We discussed risks benefits and alternatives to ESWL including bleeding, arrhythmias, failure to eliminate stone and pain. Patient voiced their understanding and consent was obtained.    Plan:  - - plan for L ESWL today   Steffan JAYSON Pea 04/19/2024, 6:41 PM

## 2024-04-20 ENCOUNTER — Ambulatory Visit (HOSPITAL_COMMUNITY)
Admission: RE | Admit: 2024-04-20 | Discharge: 2024-04-20 | Disposition: A | Discharge status: Home / Self Care | Attending: Urology | Admitting: Urology

## 2024-04-20 ENCOUNTER — Encounter (HOSPITAL_COMMUNITY): Payer: Self-pay | Admitting: Urology

## 2024-04-20 ENCOUNTER — Other Ambulatory Visit: Payer: Self-pay

## 2024-04-20 ENCOUNTER — Ambulatory Visit (HOSPITAL_COMMUNITY)

## 2024-04-20 ENCOUNTER — Encounter (HOSPITAL_COMMUNITY)
Admission: RE | Disposition: A | Discharge status: Home / Self Care | Payer: Self-pay | Source: Home / Self Care | Attending: Urology

## 2024-04-20 DIAGNOSIS — I10 Essential (primary) hypertension: Secondary | ICD-10-CM | POA: Diagnosis not present

## 2024-04-20 DIAGNOSIS — N202 Calculus of kidney with calculus of ureter: Secondary | ICD-10-CM | POA: Diagnosis not present

## 2024-04-20 DIAGNOSIS — Z87442 Personal history of urinary calculi: Secondary | ICD-10-CM | POA: Insufficient documentation

## 2024-04-20 DIAGNOSIS — I878 Other specified disorders of veins: Secondary | ICD-10-CM | POA: Diagnosis not present

## 2024-04-20 DIAGNOSIS — N201 Calculus of ureter: Secondary | ICD-10-CM | POA: Diagnosis not present

## 2024-04-20 HISTORY — PX: EXTRACORPOREAL SHOCK WAVE LITHOTRIPSY: SHX1557

## 2024-04-20 SURGERY — LITHOTRIPSY, ESWL
Anesthesia: LOCAL | Laterality: Left

## 2024-04-20 MED ORDER — SODIUM CHLORIDE 0.9 % IV SOLN: INTRAVENOUS | Status: DC

## 2024-04-20 MED ORDER — DIAZEPAM 5 MG PO TABS
10.0000 mg | ORAL_TABLET | ORAL | Status: AC
  Administered 2024-04-20: 10 mg via ORAL
  Filled 2024-04-20: qty 2

## 2024-04-20 MED ORDER — OXYCODONE HCL 5 MG PO TABS: 5.0000 mg | ORAL_TABLET | Freq: Four times a day (QID) | ORAL | 0 refills | Status: DC | PRN

## 2024-04-20 MED ORDER — TAMSULOSIN HCL 0.4 MG PO CAPS: 0.4000 mg | ORAL_CAPSULE | Freq: Every day | ORAL | 0 refills | Status: DC

## 2024-04-20 MED ORDER — DIPHENHYDRAMINE HCL 25 MG PO CAPS
25.0000 mg | ORAL_CAPSULE | ORAL | Status: AC
  Administered 2024-04-20: 25 mg via ORAL
  Filled 2024-04-20: qty 1

## 2024-04-20 MED ORDER — CIPROFLOXACIN HCL 500 MG PO TABS
500.0000 mg | ORAL_TABLET | ORAL | Status: AC
  Administered 2024-04-20: 500 mg via ORAL
  Filled 2024-04-20: qty 1

## 2024-04-20 NOTE — Discharge Instructions (Signed)
 DISCHARGE INSTRUCTIONS FOR KIDNEY STONE/ESWL MEDICATIONS:  1.  Hydrocodone  2. Tamsulosin    ACTIVITY:  1. No strenuous activity x 1week  2. No driving while on narcotic pain medications  3. Drink plenty of water   4. Continue to walk at home - it is normal to see blood in the urine while the stent is in place, so keep active, but don't over do it.  5. May return to work/school tomorrow or when you feel ready  6. You may experience some pain when urinating in the kidney on the side that was operated on while the stent is in place this is normal  BATHING:  1. You can shower and we recommend daily showers  2. You have a string coming from your urethra: The stent string is attached to your ureteral stent. Do not pull on this.   SIGNS/SYMPTOMS TO CALL:  Please call us  if you have a fever greater than 101.5, uncontrolled nausea/vomiting, uncontrolled pain, dizziness, unable to urinate, bloody urine with clots greater than the size of a quarter, chest pain, shortness of breath, leg swelling, leg pain, redness around wound, drainage from wound, or any other concerns or questions.   You can reach us  at [phone].   FOLLOW-UP:  1. Your follow up appointment has been attached to the discharge paper work

## 2024-04-23 ENCOUNTER — Encounter (HOSPITAL_COMMUNITY): Payer: Self-pay | Admitting: Urology

## 2024-05-04 DIAGNOSIS — I10 Essential (primary) hypertension: Secondary | ICD-10-CM | POA: Diagnosis not present

## 2024-05-10 DIAGNOSIS — E039 Hypothyroidism, unspecified: Secondary | ICD-10-CM | POA: Diagnosis not present

## 2024-05-10 DIAGNOSIS — I10 Essential (primary) hypertension: Secondary | ICD-10-CM | POA: Diagnosis not present

## 2024-05-10 DIAGNOSIS — E7800 Pure hypercholesterolemia, unspecified: Secondary | ICD-10-CM | POA: Diagnosis not present

## 2024-05-11 DIAGNOSIS — N201 Calculus of ureter: Secondary | ICD-10-CM | POA: Diagnosis not present

## 2024-06-03 DIAGNOSIS — I10 Essential (primary) hypertension: Secondary | ICD-10-CM | POA: Diagnosis not present

## 2024-06-10 DIAGNOSIS — E039 Hypothyroidism, unspecified: Secondary | ICD-10-CM | POA: Diagnosis not present

## 2024-06-10 DIAGNOSIS — E7800 Pure hypercholesterolemia, unspecified: Secondary | ICD-10-CM | POA: Diagnosis not present

## 2024-06-10 DIAGNOSIS — I10 Essential (primary) hypertension: Secondary | ICD-10-CM | POA: Diagnosis not present

## 2024-07-03 DIAGNOSIS — I10 Essential (primary) hypertension: Secondary | ICD-10-CM | POA: Diagnosis not present

## 2024-07-10 DIAGNOSIS — E039 Hypothyroidism, unspecified: Secondary | ICD-10-CM | POA: Diagnosis not present

## 2024-07-10 DIAGNOSIS — I10 Essential (primary) hypertension: Secondary | ICD-10-CM | POA: Diagnosis not present

## 2024-07-10 DIAGNOSIS — E7800 Pure hypercholesterolemia, unspecified: Secondary | ICD-10-CM | POA: Diagnosis not present

## 2024-08-02 DIAGNOSIS — I10 Essential (primary) hypertension: Secondary | ICD-10-CM | POA: Diagnosis not present

## 2024-08-06 DIAGNOSIS — H01004 Unspecified blepharitis left upper eyelid: Secondary | ICD-10-CM | POA: Diagnosis not present

## 2024-08-06 DIAGNOSIS — Z961 Presence of intraocular lens: Secondary | ICD-10-CM | POA: Diagnosis not present

## 2024-08-06 DIAGNOSIS — H16001 Unspecified corneal ulcer, right eye: Secondary | ICD-10-CM | POA: Diagnosis not present

## 2024-08-06 DIAGNOSIS — H01001 Unspecified blepharitis right upper eyelid: Secondary | ICD-10-CM | POA: Diagnosis not present

## 2024-08-06 DIAGNOSIS — H25042 Posterior subcapsular polar age-related cataract, left eye: Secondary | ICD-10-CM | POA: Diagnosis not present

## 2024-08-10 DIAGNOSIS — E039 Hypothyroidism, unspecified: Secondary | ICD-10-CM | POA: Diagnosis not present

## 2024-08-10 DIAGNOSIS — E7800 Pure hypercholesterolemia, unspecified: Secondary | ICD-10-CM | POA: Diagnosis not present

## 2024-08-10 DIAGNOSIS — I10 Essential (primary) hypertension: Secondary | ICD-10-CM | POA: Diagnosis not present

## 2024-09-01 DIAGNOSIS — I10 Essential (primary) hypertension: Secondary | ICD-10-CM | POA: Diagnosis not present

## 2024-09-09 DIAGNOSIS — I10 Essential (primary) hypertension: Secondary | ICD-10-CM | POA: Diagnosis not present

## 2024-09-09 DIAGNOSIS — E7800 Pure hypercholesterolemia, unspecified: Secondary | ICD-10-CM | POA: Diagnosis not present

## 2024-09-09 DIAGNOSIS — E039 Hypothyroidism, unspecified: Secondary | ICD-10-CM | POA: Diagnosis not present

## 2024-09-21 DIAGNOSIS — E7800 Pure hypercholesterolemia, unspecified: Secondary | ICD-10-CM | POA: Diagnosis not present

## 2024-09-21 DIAGNOSIS — I10 Essential (primary) hypertension: Secondary | ICD-10-CM | POA: Diagnosis not present

## 2024-09-21 DIAGNOSIS — E790 Hyperuricemia without signs of inflammatory arthritis and tophaceous disease: Secondary | ICD-10-CM | POA: Diagnosis not present

## 2024-09-21 DIAGNOSIS — E039 Hypothyroidism, unspecified: Secondary | ICD-10-CM | POA: Diagnosis not present

## 2024-10-01 ENCOUNTER — Encounter: Payer: Self-pay | Admitting: Internal Medicine

## 2024-10-30 ENCOUNTER — Telehealth: Payer: Self-pay | Admitting: *Deleted

## 2024-10-30 ENCOUNTER — Ambulatory Visit (INDEPENDENT_AMBULATORY_CARE_PROVIDER_SITE_OTHER): Admitting: Internal Medicine

## 2024-10-30 ENCOUNTER — Encounter: Payer: Self-pay | Admitting: Internal Medicine

## 2024-10-30 VITALS — BP 136/80 | HR 60 | Temp 97.4°F | Ht 73.0 in | Wt 250.2 lb

## 2024-10-30 DIAGNOSIS — R1310 Dysphagia, unspecified: Secondary | ICD-10-CM | POA: Diagnosis not present

## 2024-10-30 DIAGNOSIS — R1319 Other dysphagia: Secondary | ICD-10-CM

## 2024-10-30 DIAGNOSIS — K2100 Gastro-esophageal reflux disease with esophagitis, without bleeding: Secondary | ICD-10-CM

## 2024-10-30 NOTE — Patient Instructions (Signed)
"   It was nice to see you again today!  With your difficulty swallowing we need to go directly to an upper endoscopy with esophageal dilation as appropriate.  ASA 3/room 1 okay  We will need to stop your Eliquis 2 days prior to the procedure.  We will get the okay from the doctor that prescribed this medication  You may need to go on an acid blocking drug every day like you were on previously.  Further recommendations to follow. "

## 2024-10-30 NOTE — Telephone Encounter (Signed)
 Sent to PCP ?

## 2024-10-30 NOTE — Progress Notes (Unsigned)
 "   Gastroenterology Progress Note    Primary Care Physician:  Dayna Motto, DO Primary Gastroenterologist:  Dr. Shaaron  Pre-Procedure History & Physical: HPI:  Russell Odom is a 69 y.o. male here for   Further evaluation of chronic esophageal dysphagia to solids which has insidiously worsen over the past  couple of months.  He has had symptoms for years.  He denies much in the way of reflux.  He underwent an EGD for dysphagia about 20 years ago.  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  He has not been on a PPI.  Distant history of stroke.  It sounds like he has had atrial fibrillation and he is now on Eliquis.  He has not had any nausea or vomiting.  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   History of esophageal dilatation    History of kidney stones    HTN (hypertension)    Hypothyroid    Mixed hyperlipidemia    Stroke [facility])     Past Surgical History:  Procedure Laterality Date   BIOPSY  02/01/2018   Procedure: BIOPSY;  Surgeon: Shaaron Lamar HERO, MD;  Location: [facility];  Service: Endoscopy;;  ileocecal valve   COLONOSCOPY    07/04/2003   RMR: anal canal hemorrhoids, normal colon   COLONOSCOPY  11/02/2012   Procedure: COLONOSCOPY;  Surgeon: Lamar HERO Shaaron, MD;  Location: [facility];  Service: Endoscopy;  Laterality: N/A;  9:30   COLONOSCOPY N/A 02/01/2018   Procedure: COLONOSCOPY;  Surgeon: Shaaron Lamar HERO, MD;  Location: [facility];  Service: Endoscopy;  Laterality: N/A;  9:45   COLONOSCOPY WITH PROPOFOL  N/A 06/29/2023   Procedure: COLONOSCOPY WITH PROPOFOL ;  Surgeon: Shaaron Lamar HERO, MD;   Location: [facility];  Service: Endoscopy;  Laterality: N/A;  900am, asa 3   ESOPHAGOGASTRODUODENOSCOPY  07/04/2003   RMR: Multiple linear esophageal erosions consistent with mild-to-moderate  erosive reflux esophagitis, status post passage of a 27 F Maloney dilator/ Multiple antral and duodenal bulbar erosions; otherwise, the remainder of the stomach and the first and second portions of the duodenum appeared normal   EXTRACORPOREAL SHOCK WAVE LITHOTRIPSY Right 11/13/2018   Procedure: EXTRACORPOREAL SHOCK WAVE LITHOTRIPSY (ESWL);  Surgeon: Carolee Sherwood JONETTA DOUGLAS, MD;  Location: [facility];  Service: Urology;  Laterality: Right;   EXTRACORPOREAL SHOCK WAVE LITHOTRIPSY Right 01/29/2019   Procedure: EXTRACORPOREAL SHOCK WAVE LITHOTRIPSY (ESWL);  Surgeon: Carolee Sherwood JONETTA DOUGLAS, MD;  Location: [facility];  Service: Urology;  Laterality: Right;   EXTRACORPOREAL SHOCK WAVE LITHOTRIPSY Left 04/20/2024   Procedure: LITHOTRIPSY, ESWL;  Surgeon: Shane Steffan BROCKS, MD;  Location: [facility];  Service: Urology;  Laterality: Left;   LAPAROSCOPIC APPENDECTOMY N/A 02/06/2023   Procedure: APPENDECTOMY LAPAROSCOPIC;  Surgeon: Lyndel Deward PARAS, MD;  Location: [facility];  Service: General;  Laterality: N/A;   LUMBAR LAMINECTOMY/DECOMPRESSION MICRODISCECTOMY Left 04/09/2020   Procedure: Lumbar four through lumbar five Discectomy left;  Surgeon: Burnetta Aures, MD;  Location: [facility] OR;  Service: Orthopedics;  Laterality: Left;  2 hrs    Prior to Admission medications  Medication Sig Start Date End Date Taking? Authorizing  Provider  ELIQUIS 5 MG TABS tablet Take 5 mg by mouth 2 (two) times daily. 09/25/24  Yes [provider]  levothyroxine  (SYNTHROID ) 50 MCG tablet Take 50 mcg by mouth daily.   Yes [provider]  Multiple Vitamins-Minerals (MULTIVITAMINS THER. W/MINERALS) TABS Take 1 tablet by mouth daily.   Yes [provider]  Omega-3 Fatty Acids (FISH OIL) 1000 MG CAPS Take 1,000 mg by mouth 2 (two) times  daily.   Yes [provider]  rosuvastatin  (CRESTOR ) 20 MG tablet TAKE 1 TABLET BY MOUTH EVERY DAY 06/08/21  Yes Patwardhan, Newman PARAS, MD    Allergies as of 10/30/2024 - Review Complete 10/30/2024  Allergen Reaction Noted   Allopurinol  10/30/2024    Family History  Problem Relation Age of Onset   Colon cancer Sister 71    Social History   Socioeconomic History   Marital status: Married    Spouse name: Elveria    Number of children: 3   Years of education: 12   Highest education level: Not on file  Occupational History   Occupation: Merchandiser, Retail at [employer]   Occupation: SUPERVISOR     Employer: [employer]  Tobacco Use   Smoking status: Never   Smokeless tobacco: Never  Vaping Use   Vaping status: Never Used  Substance and Sexual Activity   Alcohol use: No   Drug use: No   Sexual activity: Not Currently  Other Topics Concern   Not on file  Social History Narrative   Patient lives at home with wife Elveria.    Patient has 3 daughters.    Patient has a high school education.    Patient is right haneded.    Patient is working at [employer].    Social Drivers of Health   Tobacco Use: Low Risk (10/30/2024)   Patient History    Smoking Tobacco Use: Never    Smokeless Tobacco Use: Never    Passive Exposure: Not on file  Financial Resource Strain: Not on file  Food Insecurity: Not on file  Transportation Needs: Not on file  Physical Activity: Not on file  Stress: Not on file  Social Connections: Not on file  Intimate Partner Violence: Not on file  Depression (EYV7-0): Not on file  Alcohol Screen: Not on file  Housing: Not on file  Utilities: Not on file  Health Literacy: Not on file    Review of Systems   See HPI, otherwise negative ROS  Physical Exam: BP 136/80   Pulse 60   Temp (!) 97.4 F (36.3 C) (Temporal)   Ht 6' 1 (1.854 m)   Wt 250 lb 3.2 oz (113.5 kg)   SpO2 98%   BMI 33.01 kg/m  General:   Alert,  Well-developed,  well-nourished, pleasant and cooperative in NAD Neck:  Supple; no masses or thyromegaly. No significant cervical adenopathy. Lungs:  Clear throughout to auscultation.   No wheezes, crackles, or rhonchi. No acute distress. Heart:  Regular rate and rhythm; no murmurs, clicks, rubs,  or gallops. Abdomen: Non-distended, normal bowel sounds.  Soft and nontender without appreciable mass or hepatosplenomegaly.  Pulses:  Normal pulses noted. Extremities:  Without clubbing or edema.   Impression/Plan:    69 year old gentleman with a known history of erosive reflux esophagitis and dysphagia requiring dilation many years ago now presents with recurrent esophageal dysphagia.  Really denies any typical reflux symptoms.  He said insidiously progressive dysphagia for a fairly long period of time now  most likely he has a ring, web  or stricture.    Further evaluation with endoscopy with potential intervention as feasible/appropriate he is needed at this time.  Recommendations:   With your difficulty swallowing we need to go directly to an upper endoscopy with esophageal dilation as appropriate.  ASA 3/room 1 okay.  The risks, benefits, limitations, alternatives and imponderables have been reviewed with the patient. Potential for esophageal dilation, biopsy, etc. have also been reviewed.  Questions have been answered. All parties agreeable.   We will need to stop your Eliquis 2 days prior to the procedure.  We will get the okay from the doctor that prescribed this medication  You may need to go on an acid blocking drug every day like you were on previously.  Further recommendations to follow.      Notice: This dictation was prepared with Dragon dictation along with smaller phrase technology. Any transcriptional errors that result from this process are unintentional and may not be corrected upon review.  "

## 2024-10-30 NOTE — Telephone Encounter (Signed)
" °  Request for patient to stop medication prior to procedure or is needing cleareance  10/30/24  BURT PIATEK 1956/06/21  What type of surgery is being performed? EGD/ESOPHAGEAL DILATION  When is surgery scheduled? TBD  What type of clearance is required (medical or pharmacy to hold medication or both? MEDICATION  Are there any medications that need to be held prior to surgery and how long? ELIQUIS X 2 DAYS PRIOR   Name of physician performing surgery?  Dr. Shaaron Rouse [facility] Phone: [phone], option 5 Fax: [phone]  Anesthesia type (none, local, MAC, general)? MAC   ? Yes ? No Patient can hold medication as requested   Signature: ___________________________   "

## 2024-11-09 NOTE — Telephone Encounter (Signed)
Clearance received and scanned into media.

## 2024-11-13 NOTE — Telephone Encounter (Addendum)
 Spoke with pt. He has been scheduled for 2/12. Aware will send instructions to his mychart but also did discuss over the phone. He voiced understanding. Also aware when to start holding eliquis

## 2024-11-22 ENCOUNTER — Encounter (HOSPITAL_COMMUNITY): Admission: RE | Payer: Self-pay

## 2024-11-22 ENCOUNTER — Ambulatory Visit (HOSPITAL_COMMUNITY): Admission: RE | Admit: 2024-11-22 | Admitting: Internal Medicine
